# Patient Record
Sex: Female | Born: 1985
Health system: Southern US, Community
[De-identification: ages and names within clinical notes are randomized; demographics above are authoritative.]

## PROBLEM LIST (undated history)

## (undated) DIAGNOSIS — E079 Disorder of thyroid, unspecified: Secondary | ICD-10-CM

## (undated) DIAGNOSIS — I10 Essential (primary) hypertension: Secondary | ICD-10-CM

## (undated) HISTORY — PX: TUBAL LIGATION: SHX77

## (undated) HISTORY — PX: CHOLECYSTECTOMY: SHX55

## (undated) HISTORY — PX: HYSTERECTOMY ABDOMINAL WITH SALPINGECTOMY: SHX6725

---

## 2008-09-23 ENCOUNTER — Emergency Department (HOSPITAL_BASED_OUTPATIENT_CLINIC_OR_DEPARTMENT_OTHER): Admission: EM | Admit: 2008-09-23 | Discharge: 2008-09-23 | Payer: Self-pay | Admitting: Emergency Medicine

## 2008-12-23 ENCOUNTER — Ambulatory Visit: Payer: Self-pay | Admitting: Diagnostic Radiology

## 2008-12-23 ENCOUNTER — Emergency Department (HOSPITAL_BASED_OUTPATIENT_CLINIC_OR_DEPARTMENT_OTHER): Admission: EM | Admit: 2008-12-23 | Discharge: 2008-12-23 | Payer: Self-pay | Admitting: Family Medicine

## 2009-05-07 ENCOUNTER — Emergency Department (HOSPITAL_BASED_OUTPATIENT_CLINIC_OR_DEPARTMENT_OTHER): Admission: EM | Admit: 2009-05-07 | Discharge: 2009-05-07 | Payer: Self-pay | Admitting: Emergency Medicine

## 2010-01-14 ENCOUNTER — Emergency Department (HOSPITAL_BASED_OUTPATIENT_CLINIC_OR_DEPARTMENT_OTHER): Admission: EM | Admit: 2010-01-14 | Discharge: 2010-01-14 | Payer: Self-pay | Admitting: Emergency Medicine

## 2010-04-17 ENCOUNTER — Emergency Department (HOSPITAL_BASED_OUTPATIENT_CLINIC_OR_DEPARTMENT_OTHER): Admission: EM | Admit: 2010-04-17 | Discharge: 2010-04-17 | Payer: Self-pay | Admitting: Emergency Medicine

## 2010-09-05 ENCOUNTER — Emergency Department (HOSPITAL_BASED_OUTPATIENT_CLINIC_OR_DEPARTMENT_OTHER)
Admission: EM | Admit: 2010-09-05 | Discharge: 2010-09-05 | Payer: Self-pay | Source: Home / Self Care | Admitting: Emergency Medicine

## 2010-10-14 ENCOUNTER — Emergency Department (HOSPITAL_BASED_OUTPATIENT_CLINIC_OR_DEPARTMENT_OTHER)
Admission: EM | Admit: 2010-10-14 | Discharge: 2010-10-14 | Payer: Self-pay | Source: Home / Self Care | Admitting: Emergency Medicine

## 2010-10-15 LAB — URINALYSIS, ROUTINE W REFLEX MICROSCOPIC
Bilirubin Urine: NEGATIVE
Hgb urine dipstick: NEGATIVE
Ketones, ur: NEGATIVE mg/dL
Nitrite: NEGATIVE
Protein, ur: NEGATIVE mg/dL
Specific Gravity, Urine: 1.018 (ref 1.005–1.030)
Urine Glucose, Fasting: NEGATIVE mg/dL
Urobilinogen, UA: 0.2 mg/dL (ref 0.0–1.0)
pH: 7.5 (ref 5.0–8.0)

## 2010-10-15 LAB — URINE MICROSCOPIC-ADD ON

## 2010-10-15 LAB — PREGNANCY, URINE: Preg Test, Ur: POSITIVE

## 2010-12-09 LAB — COMPREHENSIVE METABOLIC PANEL
ALT: 20 U/L (ref 0–35)
AST: 24 U/L (ref 0–37)
Albumin: 3.9 g/dL (ref 3.5–5.2)
Alkaline Phosphatase: 80 U/L (ref 39–117)
BUN: 13 mg/dL (ref 6–23)
CO2: 28 mEq/L (ref 19–32)
Calcium: 9 mg/dL (ref 8.4–10.5)
Chloride: 106 mEq/L (ref 96–112)
Creatinine, Ser: 0.7 mg/dL (ref 0.4–1.2)
GFR calc Af Amer: >60 mL/min (ref >60)
GFR calc non Af Amer: >60 mL/min (ref >60)
Glucose, Bld: 85 mg/dL (ref 70–99)
Potassium: 4.1 mEq/L (ref 3.5–5.1)
Sodium: 145 mEq/L (ref 135–145)
Total Bilirubin: 0.6 mg/dL (ref 0.3–1.2)
Total Protein: 7.8 g/dL (ref 6.0–8.3)

## 2010-12-09 LAB — URINALYSIS, ROUTINE W REFLEX MICROSCOPIC
Bilirubin Urine: NEGATIVE
Glucose, UA: NEGATIVE mg/dL
Ketones, ur: NEGATIVE mg/dL
Leukocytes, UA: NEGATIVE
Nitrite: NEGATIVE
Protein, ur: NEGATIVE mg/dL
Specific Gravity, Urine: 1.02 (ref 1.005–1.030)
Urobilinogen, UA: 0.2 mg/dL (ref 0.0–1.0)
pH: 7 (ref 5.0–8.0)

## 2010-12-09 LAB — URINE MICROSCOPIC-ADD ON

## 2010-12-09 LAB — CBC
HCT: 34.3 % — ABNORMAL LOW (ref 36.0–46.0)
Hemoglobin: 11.4 g/dL — ABNORMAL LOW (ref 12.0–15.0)
MCH: 25.6 pg — ABNORMAL LOW (ref 26.0–34.0)
MCHC: 33.2 g/dL (ref 30.0–36.0)
MCV: 76.9 fL — ABNORMAL LOW (ref 78.0–100.0)
Platelets: 205 10*3/uL (ref 150–400)
RBC: 4.46 MIL/uL (ref 3.87–5.11)
RDW: 15.3 % (ref 11.5–15.5)
WBC: 4.8 10*3/uL (ref 4.0–10.5)

## 2010-12-09 LAB — DIFFERENTIAL
Basophils Absolute: 0 10*3/uL (ref 0.0–0.1)
Basophils Relative: 0 % (ref 0–1)
Eosinophils Absolute: 0.1 10*3/uL (ref 0.0–0.7)
Eosinophils Relative: 3 % (ref 0–5)
Lymphocytes Relative: 27 % (ref 12–46)
Lymphs Abs: 1.3 10*3/uL (ref 0.7–4.0)
Monocytes Absolute: 0.4 10*3/uL (ref 0.1–1.0)
Monocytes Relative: 9 % (ref 3–12)
Neutro Abs: 2.9 10*3/uL (ref 1.7–7.7)
Neutrophils Relative %: 61 % (ref 43–77)

## 2010-12-09 LAB — WET PREP, GENITAL
Trich, Wet Prep: NONE SEEN
Yeast Wet Prep HPF POC: NONE SEEN

## 2010-12-09 LAB — LIPASE, BLOOD: Lipase: 20 U/L — ABNORMAL LOW (ref 23–300)

## 2010-12-09 LAB — GC/CHLAMYDIA PROBE AMP, GENITAL
Chlamydia, DNA Probe: NEGATIVE
GC Probe Amp, Genital: NEGATIVE

## 2010-12-09 LAB — PREGNANCY, URINE: Preg Test, Ur: NEGATIVE

## 2010-12-16 LAB — URINALYSIS, ROUTINE W REFLEX MICROSCOPIC
Bilirubin Urine: NEGATIVE
Glucose, UA: NEGATIVE mg/dL
Hgb urine dipstick: NEGATIVE
Ketones, ur: NEGATIVE mg/dL
Nitrite: NEGATIVE
Protein, ur: NEGATIVE mg/dL
Specific Gravity, Urine: 1.016 (ref 1.005–1.030)
Urobilinogen, UA: 0.2 mg/dL (ref 0.0–1.0)
pH: 7 (ref 5.0–8.0)

## 2010-12-16 LAB — CBC
HCT: 32.2 % — ABNORMAL LOW (ref 36.0–46.0)
Hemoglobin: 10.8 g/dL — ABNORMAL LOW (ref 12.0–15.0)
MCHC: 33.5 g/dL (ref 30.0–36.0)
MCV: 79.9 fL (ref 78.0–100.0)
Platelets: 203 10*3/uL (ref 150–400)
RBC: 4.03 MIL/uL (ref 3.87–5.11)
RDW: 15.2 % (ref 11.5–15.5)
WBC: 4.7 10*3/uL (ref 4.0–10.5)

## 2010-12-16 LAB — DIFFERENTIAL
Basophils Absolute: 0.1 10*3/uL (ref 0.0–0.1)
Basophils Relative: 1 % (ref 0–1)
Eosinophils Absolute: 0.1 10*3/uL (ref 0.0–0.7)
Eosinophils Relative: 2 % (ref 0–5)
Lymphocytes Relative: 31 % (ref 12–46)
Lymphs Abs: 1.5 10*3/uL (ref 0.7–4.0)
Monocytes Absolute: 0.4 10*3/uL (ref 0.1–1.0)
Monocytes Relative: 10 % (ref 3–12)
Neutro Abs: 2.6 10*3/uL (ref 1.7–7.7)
Neutrophils Relative %: 56 % (ref 43–77)

## 2010-12-16 LAB — URINE MICROSCOPIC-ADD ON

## 2010-12-16 LAB — PREGNANCY, URINE: Preg Test, Ur: NEGATIVE

## 2010-12-16 LAB — HEMOCCULT GUIAC POC 1CARD (OFFICE): Fecal Occult Bld: NEGATIVE

## 2011-01-03 LAB — URINALYSIS, ROUTINE W REFLEX MICROSCOPIC
Bilirubin Urine: NEGATIVE
Glucose, UA: NEGATIVE mg/dL
Hgb urine dipstick: NEGATIVE
Ketones, ur: NEGATIVE mg/dL
Nitrite: NEGATIVE
Protein, ur: NEGATIVE mg/dL
Specific Gravity, Urine: 1.025 (ref 1.005–1.030)
Urobilinogen, UA: 1 mg/dL (ref 0.0–1.0)
pH: 6.5 (ref 5.0–8.0)

## 2011-01-03 LAB — DIFFERENTIAL
Lymphocytes Relative: 21 % (ref 12–46)
Lymphs Abs: 1.6 10*3/uL (ref 0.7–4.0)
Monocytes Absolute: 0.6 10*3/uL (ref 0.1–1.0)
Monocytes Relative: 8 % (ref 3–12)
Neutro Abs: 5.3 10*3/uL (ref 1.7–7.7)

## 2011-01-03 LAB — BASIC METABOLIC PANEL
CO2: 29 mEq/L (ref 19–32)
Calcium: 9.3 mg/dL (ref 8.4–10.5)
GFR calc Af Amer: >60 mL/min (ref >60)
GFR calc non Af Amer: >60 mL/min (ref >60)
Sodium: 138 mEq/L (ref 135–145)

## 2011-01-03 LAB — CBC
Hemoglobin: 11.9 g/dL — ABNORMAL LOW (ref 12.0–15.0)
RBC: 4.11 MIL/uL (ref 3.87–5.11)
WBC: 7.8 10*3/uL (ref 4.0–10.5)

## 2011-01-03 LAB — URINE MICROSCOPIC-ADD ON

## 2011-01-03 LAB — PREGNANCY, URINE: Preg Test, Ur: POSITIVE

## 2011-01-08 LAB — URINALYSIS, ROUTINE W REFLEX MICROSCOPIC
Glucose, UA: NEGATIVE mg/dL
pH: 5 (ref 5.0–8.0)

## 2011-01-08 LAB — CBC
HCT: 37.9 % (ref 36.0–46.0)
Hemoglobin: 12.6 g/dL (ref 12.0–15.0)
Platelets: 206 10*3/uL (ref 150–400)
WBC: 7.4 10*3/uL (ref 4.0–10.5)

## 2011-01-08 LAB — COMPREHENSIVE METABOLIC PANEL
ALT: 16 U/L (ref 0–35)
Albumin: 4 g/dL (ref 3.5–5.2)
Alkaline Phosphatase: 71 U/L (ref 39–117)
BUN: 14 mg/dL (ref 6–23)
Chloride: 101 mEq/L (ref 96–112)
Glucose, Bld: 94 mg/dL (ref 70–99)
Potassium: 3.8 mEq/L (ref 3.5–5.1)
Sodium: 141 mEq/L (ref 135–145)
Total Bilirubin: 0.4 mg/dL (ref 0.3–1.2)

## 2011-01-08 LAB — GC/CHLAMYDIA PROBE AMP, GENITAL: GC Probe Amp, Genital: NEGATIVE

## 2011-01-08 LAB — WET PREP, GENITAL: Yeast Wet Prep HPF POC: NONE SEEN

## 2012-01-12 ENCOUNTER — Encounter (HOSPITAL_BASED_OUTPATIENT_CLINIC_OR_DEPARTMENT_OTHER): Payer: Self-pay | Admitting: *Deleted

## 2012-01-12 ENCOUNTER — Emergency Department (HOSPITAL_BASED_OUTPATIENT_CLINIC_OR_DEPARTMENT_OTHER)
Admission: EM | Admit: 2012-01-12 | Discharge: 2012-01-12 | Disposition: A | Discharge status: Home / Self Care | Payer: Self-pay | Attending: Emergency Medicine | Admitting: Emergency Medicine

## 2012-01-12 DIAGNOSIS — M545 Low back pain, unspecified: Secondary | ICD-10-CM | POA: Insufficient documentation

## 2012-01-12 DIAGNOSIS — M549 Dorsalgia, unspecified: Secondary | ICD-10-CM

## 2012-01-12 LAB — URINE MICROSCOPIC-ADD ON

## 2012-01-12 LAB — URINALYSIS, ROUTINE W REFLEX MICROSCOPIC
Glucose, UA: NEGATIVE mg/dL
Ketones, ur: NEGATIVE mg/dL
Nitrite: NEGATIVE
Protein, ur: NEGATIVE mg/dL
Urobilinogen, UA: 0.2 mg/dL (ref 0.0–1.0)

## 2012-01-12 LAB — PREGNANCY, URINE: Preg Test, Ur: NEGATIVE

## 2012-01-12 MED ORDER — IBUPROFEN 800 MG PO TABS: 800.0000 mg | ORAL_TABLET | Freq: Three times a day (TID) | ORAL | Status: AC

## 2012-01-12 MED ORDER — OXYCODONE-ACETAMINOPHEN 5-325 MG PO TABS: 2.0000 | ORAL_TABLET | ORAL | Status: AC | PRN

## 2012-01-12 NOTE — ED Notes (Signed)
Pt states that she has had intermittent left flank pain x one week states that pain worsened significantly last Pm and today pt also with intermittent nausea. Pt denies hematuria

## 2012-01-12 NOTE — Discharge Instructions (Signed)
Back Pain, Adult Low back pain is very common. About 1 in 5 people have back pain.The cause of low back pain is rarely dangerous. The pain often gets better over time.About half of people with a sudden onset of back pain feel better in just 2 weeks. About 8 in 10 people feel better by 6 weeks.  CAUSES Some common causes of back pain include:  Strain of the muscles or ligaments supporting the spine.   Wear and tear (degeneration) of the spinal discs.   Arthritis.   Direct injury to the back.  DIAGNOSIS Most of the time, the direct cause of low back pain is not known.However, back pain can be treated effectively even when the exact cause of the pain is unknown.Answering your caregiver's questions about your overall health and symptoms is one of the most accurate ways to make sure the cause of your pain is not dangerous. If your caregiver needs more information, he or she may order lab work or imaging tests (X-rays or MRIs).However, even if imaging tests show changes in your back, this usually does not require surgery. HOME CARE INSTRUCTIONS For many people, back pain returns.Since low back pain is rarely dangerous, it is often a condition that people can learn to manageon their own.   Remain active. It is stressful on the back to sit or stand in one place. Do not sit, drive, or stand in one place for more than 30 minutes at a time. Take short walks on level surfaces as soon as pain allows.Try to increase the length of time you walk each day.   Do not stay in bed.Resting more than 1 or 2 days can delay your recovery.   Do not avoid exercise or work.Your body is made to move.It is not dangerous to be active, even though your back may hurt.Your back will likely heal faster if you return to being active before your pain is gone.   Pay attention to your body when you bend and lift. Many people have less discomfortwhen lifting if they bend their knees, keep the load close to their  bodies,and avoid twisting. Often, the most comfortable positions are those that put less stress on your recovering back.   Find a comfortable position to sleep. Use a firm mattress and lie on your side with your knees slightly bent. If you lie on your back, put a pillow under your knees.   Only take over-the-counter or prescription medicines as directed by your caregiver. Over-the-counter medicines to reduce pain and inflammation are often the most helpful.Your caregiver may prescribe muscle relaxant drugs.These medicines help dull your pain so you can more quickly return to your normal activities and healthy exercise.   Put ice on the injured area.   Put ice in a plastic bag.   Place a towel between your skin and the bag.   Leave the ice on for 15 to 20 minutes, 3 to 4 times a day for the first 2 to 3 days. After that, ice and heat may be alternated to reduce pain and spasms.   Ask your caregiver about trying back exercises and gentle massage. This may be of some benefit.   Avoid feeling anxious or stressed.Stress increases muscle tension and can worsen back pain.It is important to recognize when you are anxious or stressed and learn ways to manage it.Exercise is a great option.  SEEK MEDICAL CARE IF:  You have pain that is not relieved with rest or medicine.   You have   pain that does not improve in 1 week.   You have new symptoms.   You are generally not feeling well.  SEEK IMMEDIATE MEDICAL CARE IF:   You have pain that radiates from your back into your legs.   You develop new bowel or bladder control problems.   You have unusual weakness or numbness in your arms or legs.   You develop nausea or vomiting.   You develop abdominal pain.   You feel faint.  Document Released: 09/14/2005 Document Revised: 09/03/2011 Document Reviewed: 02/02/2011 [employer] Patient Information 2012 [employer]. 

## 2012-01-12 NOTE — ED Notes (Signed)
PA in to evaluate  

## 2012-01-12 NOTE — ED Provider Notes (Signed)
History     CSN: [id number]  Arrival date & time 01/12/12  2007   First MD Initiated Contact with Patient 01/12/12 2016      Chief Complaint  Patient presents with  . Flank Pain    (Consider location/radiation/quality/duration/timing/severity/associated sxs/prior treatment) Patient is a 26 y.o. female presenting with back pain. The history is provided by the patient. No language interpreter was used.  Back Pain  This is a new problem. The current episode started more than 1 week ago. The problem occurs constantly. The problem has not changed since onset.The pain is associated with no known injury. The pain is present in the lumbar spine. The quality of the pain is described as aching. The pain does not radiate. The pain is at a severity of 6/10. The pain is moderate. The symptoms are aggravated by bending. The pain is the same all the time. Stiffness is present all day. She has tried nothing for the symptoms. The treatment provided no relief. Risk factors include obesity.  Pt denies any injury.  Pt has had urinary tract infections in the past.  History reviewed. No pertinent past medical history.  Past Surgical History  Procedure Date  . Cesarean section   . Tubal ligation     History reviewed. No pertinent family history.  History  Substance Use Topics  . Smoking status: Never Smoker   . Smokeless tobacco: Not on file  . Alcohol Use: No    OB History    Grav Para Term Preterm Abortions TAB SAB Ect Mult Living                  Review of Systems  Musculoskeletal: Positive for back pain.  All other systems reviewed and are negative.    Allergies  Hydrocodone bitartrate  Home Medications   Current Outpatient Rx  Name Route Sig Dispense Refill  . DIPHENHYDRAMINE-APAP (SLEEP) 25-500 MG PO TABS Oral Take 2 tablets by mouth at bedtime as needed. For pain      BP 155/99  Pulse 60  Temp 98.8 F (37.1 C)  Resp 18  SpO2 100%  LMP 12/04/2011  Physical Exam    Vitals reviewed. Constitutional: She is oriented to person, place, and time. She appears well-developed and well-nourished.  HENT:  Head: Normocephalic.  Neck: Normal range of motion.  Cardiovascular: Normal heart sounds.   Pulmonary/Chest: Effort normal and breath sounds normal.  Abdominal: Soft. There is no tenderness.  Musculoskeletal: She exhibits tenderness.       Tender ls spine,  Decreased range of motion,  nv and ns intact  Neurological: She is alert and oriented to person, place, and time.  Skin: Skin is warm.  Psychiatric: She has a normal mood and affect.    ED Course  Procedures (including critical care time)  Labs Reviewed  URINALYSIS, ROUTINE W REFLEX MICROSCOPIC - Abnormal; Notable for the following:    Leukocytes, UA TRACE (*)    All other components within normal limits  PREGNANCY, URINE  URINE MICROSCOPIC-ADD ON   No results found.   No diagnosis found.    MDM  Pt given Rx for ibuprofen and Hydrocodone.  I referred to Dr. Pearletha Forge for follow up of back pain       Lonia Skinner [place], [place] 01/12/12 2055

## 2012-01-13 NOTE — ED Provider Notes (Signed)
Medical screening examination/treatment/procedure(s) were performed by non-physician practitioner and as supervising physician I was immediately available for consultation/collaboration.  Geoffery Lyons, MD 01/13/12 2006

## 2012-03-15 ENCOUNTER — Encounter (HOSPITAL_BASED_OUTPATIENT_CLINIC_OR_DEPARTMENT_OTHER): Payer: Self-pay | Admitting: *Deleted

## 2012-03-15 ENCOUNTER — Emergency Department (HOSPITAL_BASED_OUTPATIENT_CLINIC_OR_DEPARTMENT_OTHER)
Admission: EM | Admit: 2012-03-15 | Discharge: 2012-03-15 | Disposition: A | Discharge status: Home / Self Care | Payer: Self-pay | Attending: Emergency Medicine | Admitting: Emergency Medicine

## 2012-03-15 DIAGNOSIS — K029 Dental caries, unspecified: Secondary | ICD-10-CM

## 2012-03-15 MED ORDER — PENICILLIN V POTASSIUM 250 MG PO TABS: 250.0000 mg | ORAL_TABLET | Freq: Four times a day (QID) | ORAL | Status: AC

## 2012-03-15 MED ORDER — OXYCODONE-ACETAMINOPHEN 5-325 MG PO TABS
1.0000 | ORAL_TABLET | Freq: Once | ORAL | Status: AC
  Administered 2012-03-15: 1 via ORAL
  Filled 2012-03-15: qty 1

## 2012-03-15 MED ORDER — OXYCODONE-ACETAMINOPHEN 5-325 MG PO TABS: 1.0000 | ORAL_TABLET | Freq: Four times a day (QID) | ORAL | Status: AC | PRN

## 2012-03-15 NOTE — ED Notes (Signed)
Pt c/o left lower jaw and tooth pain x 3 weeks

## 2012-03-15 NOTE — ED Provider Notes (Addendum)
History     CSN: [id number]  Arrival date & time 03/15/12  1711   First MD Initiated Contact with Patient 03/15/12 1724      Chief Complaint  Patient presents with  . Dental Pain    (Consider location/radiation/quality/duration/timing/severity/associated sxs/prior treatment) HPI Comments: Pain is a 9/10 and sharp in nature  Patient is a 26 y.o. female presenting with tooth pain. The history is provided by the patient.  Dental PainThe primary symptoms include mouth pain. Primary symptoms do not include dental injury, fever or shortness of breath. Episode onset: 3 weeks ago but severe pain for the last 2 days. The symptoms are worsening. The symptoms are new. The symptoms occur constantly.  Additional symptoms include: gum tenderness, jaw pain and ear pain. Additional symptoms do not include: gum swelling, trismus, facial swelling, pain with swallowing, taste disturbance, smell disturbance, hearing loss and swollen glands. Medical issues do not include: alcohol problem and smoking.    History reviewed. No pertinent past medical history.  Past Surgical History  Procedure Date  . Cesarean section   . Tubal ligation     History reviewed. No pertinent family history.  History  Substance Use Topics  . Smoking status: Never Smoker   . Smokeless tobacco: Not on file  . Alcohol Use: No    OB History    Grav Para Term Preterm Abortions TAB SAB Ect Mult Living                  Review of Systems  Constitutional: Negative for fever.  HENT: Positive for ear pain. Negative for hearing loss and facial swelling.   Respiratory: Negative for shortness of breath.   All other systems reviewed and are negative.    Allergies  Hydrocodone bitartrate  Home Medications   Current Outpatient Rx  Name Route Sig Dispense Refill  . DIPHENHYDRAMINE-APAP (SLEEP) 25-500 MG PO TABS Oral Take 2 tablets by mouth at bedtime as needed. For pain      BP 154/100  Pulse 84  Temp 98.7 F (37.1  C) (Oral)  Resp 18  Ht 5\' 1"  (1.549 m)  Wt 260 lb (117.935 kg)  BMI 49.13 kg/m2  SpO2 100%  LMP 01/27/2012  Physical Exam  Nursing note and vitals reviewed. Constitutional: She is oriented to person, place, and time. She appears well-developed and well-nourished. She appears distressed.  HENT:  Head: Normocephalic and atraumatic. No trismus in the jaw.  Left Ear: Tympanic membrane and ear canal normal.  Mouth/Throat: Oropharynx is clear and moist and mucous membranes are normal. Dental caries present. No dental abscesses or uvula swelling.    Eyes: EOM are normal. Pupils are equal, round, and reactive to light.  Neck: Normal range of motion. Neck supple.  Pulmonary/Chest: Effort normal. No respiratory distress.  Musculoskeletal: Normal range of motion. She exhibits no tenderness.       No edema  Lymphadenopathy:    She has no cervical adenopathy.  Neurological: She is alert and oriented to person, place, and time. No cranial nerve deficit.  Skin: Skin is warm and dry. No rash noted.  Psychiatric: She has a normal mood and affect. Her behavior is normal.    ED Course  Procedures (including critical care time)  Labs Reviewed - No data to display No results found.   1. Dental caries       MDM   Pt with dental caries and no facial swelling.  No signs of ludwig's angina or difficulty swallowing and no  systemic symptoms. Will treat with PCN and have pt f/u with dentist.         Gwyneth Sprout, MD 03/15/12 1731  Gwyneth Sprout, MD 03/15/12 1734

## 2012-11-14 IMAGING — US US TRANSVAGINAL NON-OB
1 series · 13 of 25 positions shown · non-contrast
Comparison: CT [DATE]

CLINICAL DATA: Right lower quadrant pain.  Heavy vaginal bleeding
with nausea and dizziness.  IUD for 6 months.  Unsure LMP.



[Series 1: us transvaginal non-ob · 0.33mm/px · 13 of 49 slices shown]
[im 1/49]
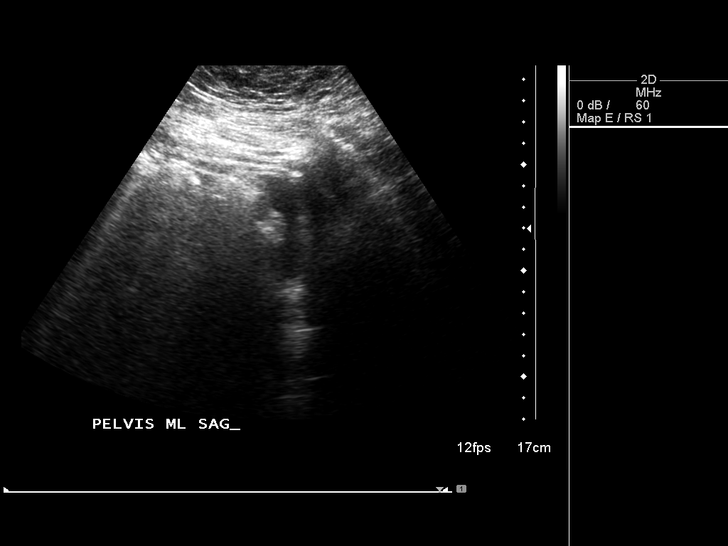
[im 5/49]
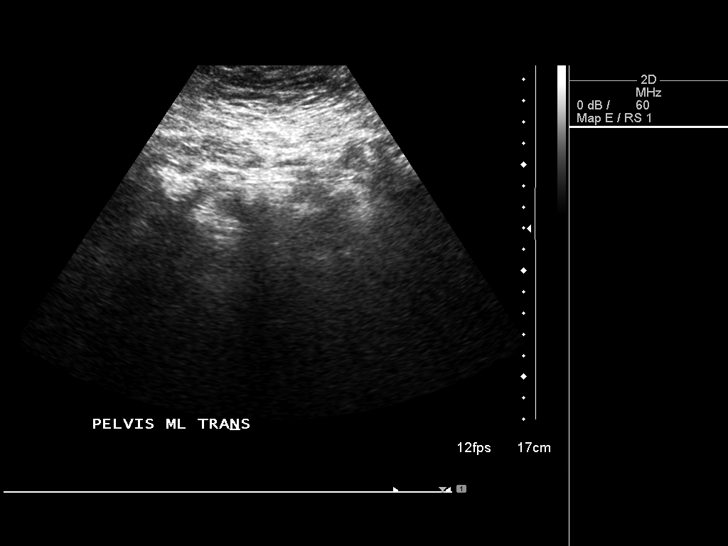
[im 9/49]
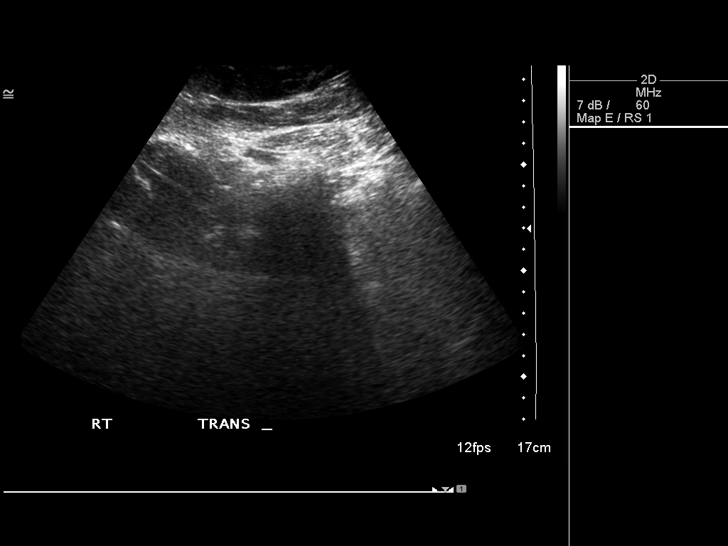
[im 13/49]
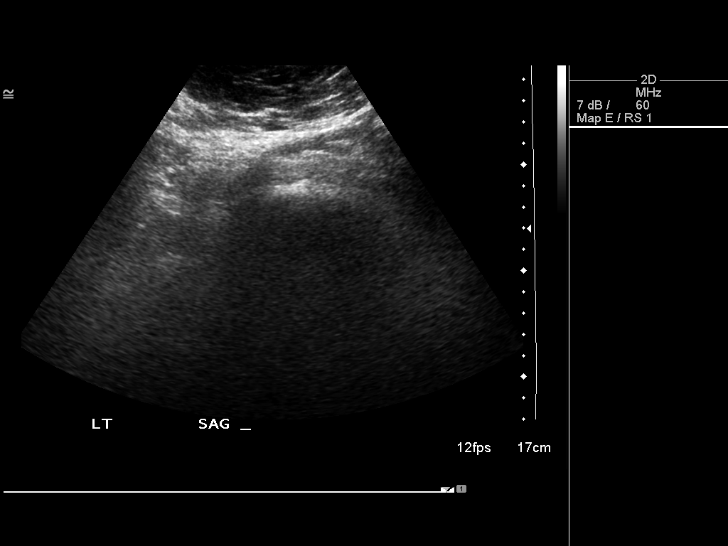
[im 17/49]
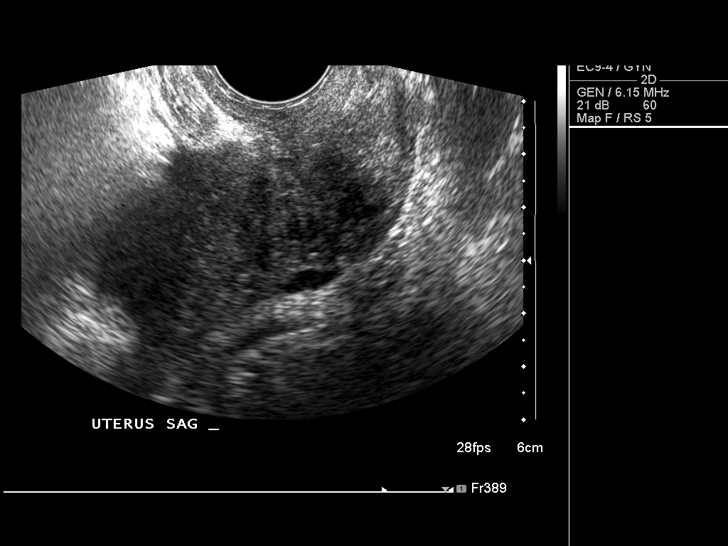
[im 21/49]
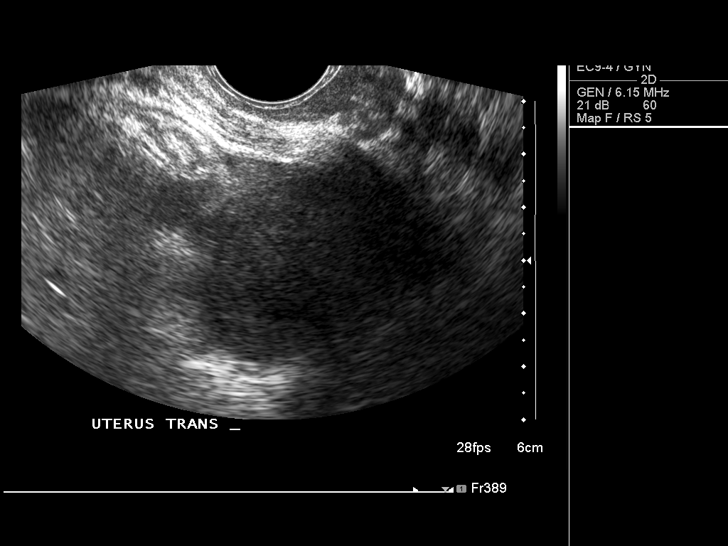
[im 25/49]
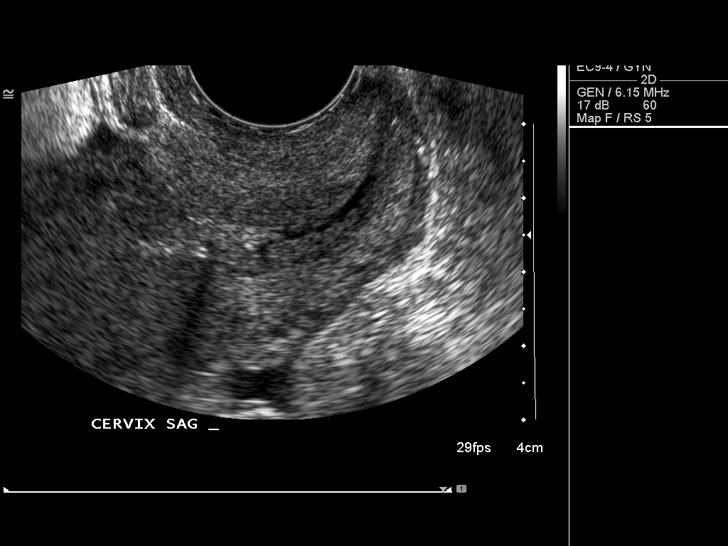
[im 29/49]
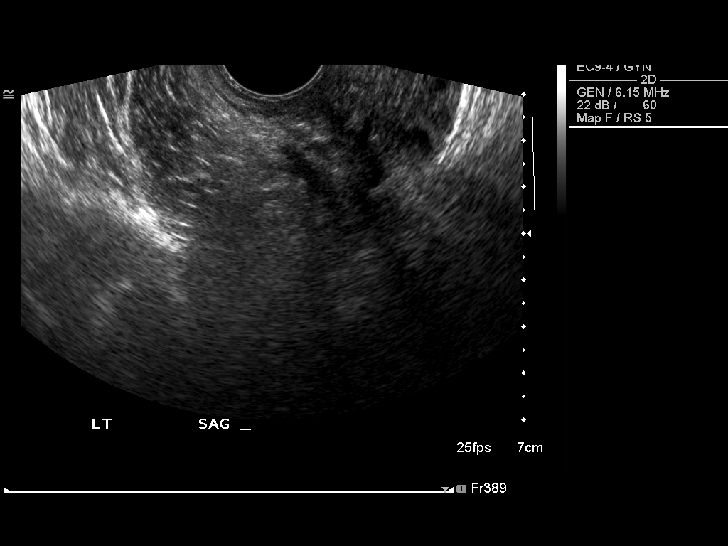
[im 33/49]
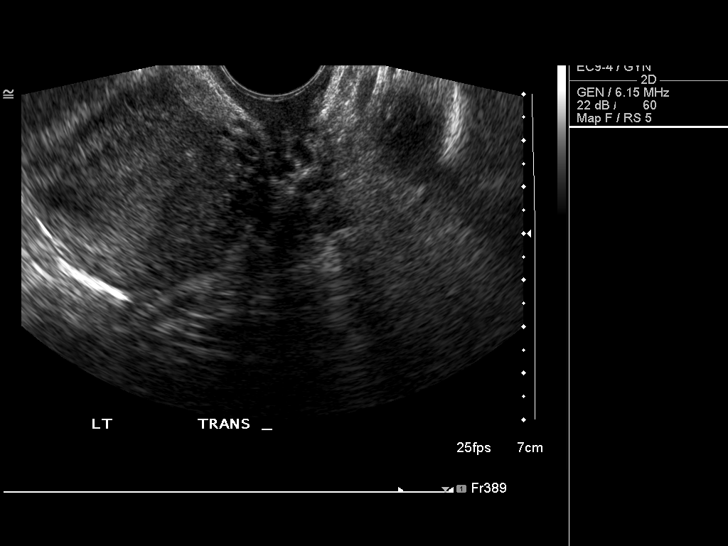
[im 37/49]
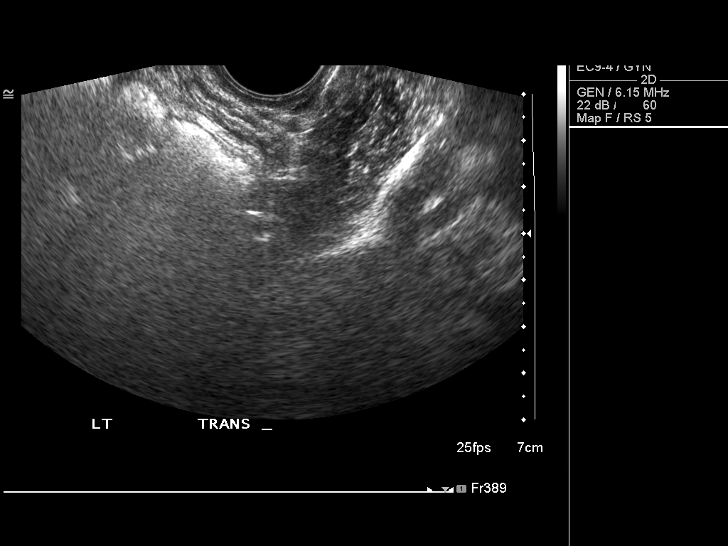
[im 41/49]
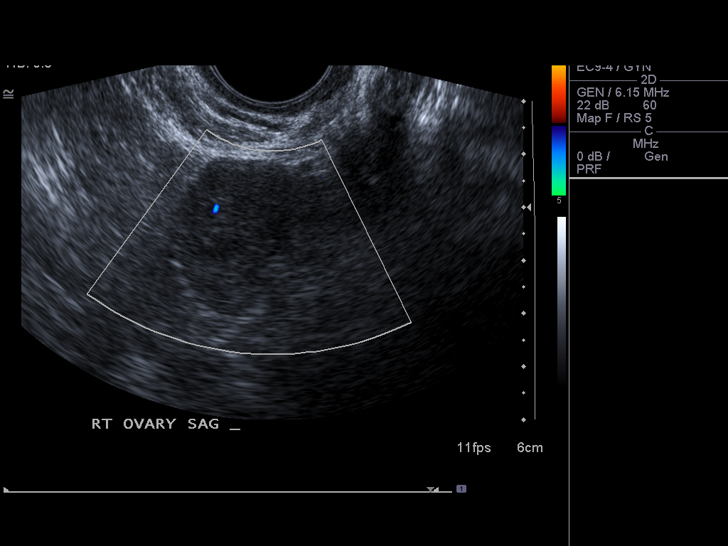
[im 45/49]
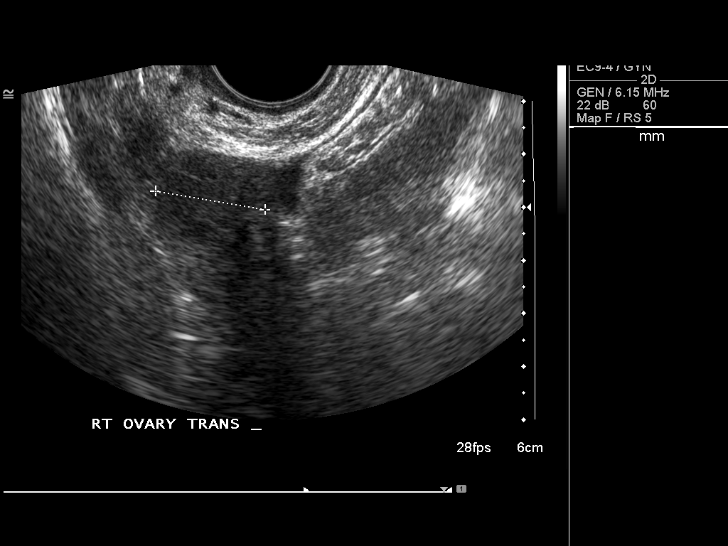
[im 49/49]
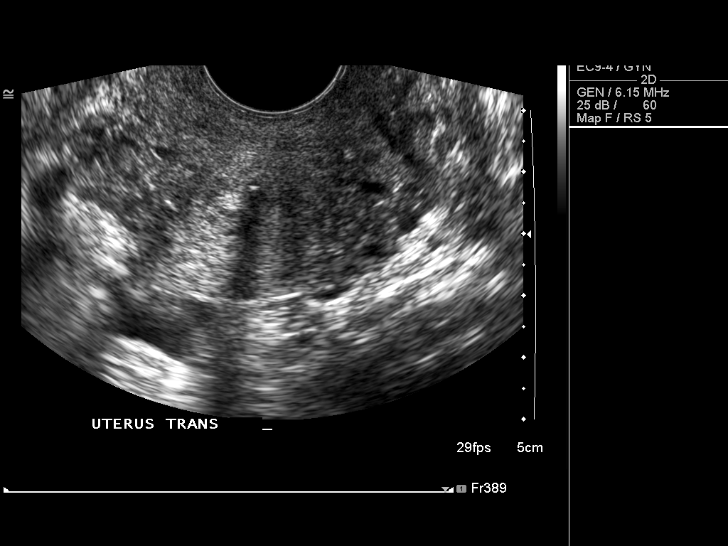

[13 of 25 positions shown; findings below may reference images not displayed]

FINDINGS: Uterus the uterus demonstrates a sagittal length of 7.4 cm, an AP
depth of 3.5 cm and a transverse width of 4.3 cm.  A homogeneous
uterine myometrium is seen.

Endometrium  the patient's IUD is malpositioned with position in
the region of the endocervical canal. The position of the IUD limbs
can not be determined on these 2D images. The upper portion of the
endometrial lining is homogeneously echogenic with an AP width of
6.4 mm.  No areas of focal thickening are identified.

Right Ovary measures 3.1 x 2.3 x 2.1 cm and has a normal appearance

Left Ovary is not seen with confidence either transabdominally or
endovaginally.

Other Findings:  No pelvic fluid or separate adnexal masses are
noted.
IMPRESSION: IUD malpositioning with positioning in the region of  the
endocervical canal.

Otherwise unremarkable uterine myometrium, endometrium and ovaries.

## 2012-12-29 ENCOUNTER — Other Ambulatory Visit: Payer: Self-pay | Admitting: Emergency Medicine

## 2012-12-29 DIAGNOSIS — G44009 Cluster headache syndrome, unspecified, not intractable: Secondary | ICD-10-CM

## 2012-12-30 ENCOUNTER — Other Ambulatory Visit: Payer: Self-pay

## 2013-02-06 ENCOUNTER — Emergency Department (HOSPITAL_BASED_OUTPATIENT_CLINIC_OR_DEPARTMENT_OTHER)
Admission: EM | Admit: 2013-02-06 | Discharge: 2013-02-06 | Disposition: A | Discharge status: Home / Self Care | Payer: BC Managed Care – PPO | Attending: Emergency Medicine | Admitting: Emergency Medicine

## 2013-02-06 ENCOUNTER — Encounter (HOSPITAL_BASED_OUTPATIENT_CLINIC_OR_DEPARTMENT_OTHER): Payer: Self-pay | Admitting: *Deleted

## 2013-02-06 ENCOUNTER — Emergency Department (HOSPITAL_BASED_OUTPATIENT_CLINIC_OR_DEPARTMENT_OTHER): Payer: BC Managed Care – PPO

## 2013-02-06 DIAGNOSIS — J020 Streptococcal pharyngitis: Secondary | ICD-10-CM | POA: Insufficient documentation

## 2013-02-06 DIAGNOSIS — H9209 Otalgia, unspecified ear: Secondary | ICD-10-CM | POA: Insufficient documentation

## 2013-02-06 DIAGNOSIS — R51 Headache: Secondary | ICD-10-CM | POA: Insufficient documentation

## 2013-02-06 DIAGNOSIS — R059 Cough, unspecified: Secondary | ICD-10-CM | POA: Insufficient documentation

## 2013-02-06 DIAGNOSIS — R05 Cough: Secondary | ICD-10-CM | POA: Insufficient documentation

## 2013-02-06 DIAGNOSIS — R509 Fever, unspecified: Secondary | ICD-10-CM | POA: Insufficient documentation

## 2013-02-06 LAB — RAPID STREP SCREEN (MED CTR MEBANE ONLY): Streptococcus, Group A Screen (Direct): POSITIVE — AB

## 2013-02-06 MED ORDER — HYDROCODONE-ACETAMINOPHEN 7.5-500 MG/15ML PO SOLN: 15.0000 mL | Freq: Four times a day (QID) | ORAL | Status: DC | PRN

## 2013-02-06 MED ORDER — IBUPROFEN 800 MG PO TABS: 800.0000 mg | ORAL_TABLET | Freq: Once | ORAL | Status: DC

## 2013-02-06 MED ORDER — IBUPROFEN 100 MG/5ML PO SUSP
800.0000 mg | Freq: Once | ORAL | Status: AC
  Administered 2013-02-06: 800 mg via ORAL
  Filled 2013-02-06: qty 40

## 2013-02-06 MED ORDER — PENICILLIN G BENZATHINE 1200000 UNIT/2ML IM SUSP
1.2000 10*6.[IU] | Freq: Once | INTRAMUSCULAR | Status: AC
  Administered 2013-02-06: 1.2 10*6.[IU] via INTRAMUSCULAR
  Filled 2013-02-06: qty 2

## 2013-02-06 NOTE — ED Provider Notes (Signed)
History  This chart was scribed for Glynn Octave, MD by Shari Heritage, ED Scribe. The patient was seen in room MH05/MH05. Patient's care was started at 2125.   CSN: [id number]  Arrival date & time 02/06/13  2115   First MD Initiated Contact with Patient 02/06/13 2125      Chief Complaint  Patient presents with  . Sore Throat     The history is provided by the patient. No language interpreter was used.    HPI Comments: Wendy Gordon is a 27 y.o. female who presents to the Emergency Department complaining of persistent, moderate sore throat and bilateral ear pain onset 3 days ago. She states that symptoms have gradually worsened since onset. She says throat pain is worse with palpation and she has pain with swallowing. There is associated mild headache and intermittent subjective fever. She also reports of cough productive of thick sputum. She denies abdominal pain, rhinorrhea, chest pain, shortness of breath or any other symptoms. She hasn't taken any recent long car or plane trips. She denies any sick contacts with similar symptoms. Patient has no chronic medical conditions. She does not take any medicines on a regular basis. She isn't on any birth control. She does not smoke cigarettes.   History reviewed. No pertinent past medical history.  Past Surgical History  Procedure Laterality Date  . Cesarean section    . Tubal ligation      No family history on file.  History  Substance Use Topics  . Smoking status: Never Smoker   . Smokeless tobacco: Not on file  . Alcohol Use: No    OB History   Grav Para Term Preterm Abortions TAB SAB Ect Mult Living                  Review of Systems A complete 10 system review of systems was obtained and all systems are negative except as noted in the HPI and PMH.   Allergies  Hydrocodone bitartrate  Home Medications   Current Outpatient Rx  Name  Route  Sig  Dispense  Refill  . diphenhydramine-acetaminophen (TYLENOL PM EXTRA  STRENGTH) 25-500 MG TABS   Oral   Take 2 tablets by mouth at bedtime as needed. For pain         . HYDROcodone-acetaminophen (LORTAB) 7.5-500 MG/15ML solution   Oral   Take 15 mLs by mouth every 6 (six) hours as needed for pain.   120 mL   0   . ibuprofen (ADVIL,MOTRIN) 200 MG tablet   Oral   Take 800 mg by mouth every 6 (six) hours as needed. Patient used this medication for her pain.           Triage Vitals: BP 136/94  Pulse 102  Temp(Src) 99.1 F (37.3 C) (Oral)  Resp 20  Wt 260 lb (117.935 kg)  BMI 49.15 kg/m2  SpO2 100%  Physical Exam  Constitutional: She is oriented to person, place, and time. She appears well-developed and well-nourished.  HENT:  Head: Normocephalic and atraumatic.  Right Ear: A middle ear effusion is present.  Left Ear: Tympanic membrane, external ear and ear canal normal.  Mouth/Throat: Oropharyngeal exudate and posterior oropharyngeal erythema present.  Bilateral tonsils erythematous with exudate. No asymmetry. Serous effusion on the right. Left ear is normal.  Eyes: Conjunctivae and EOM are normal. Pupils are equal, round, and reactive to light.  Neck: Normal range of motion. Neck supple.  No meningismus.   Cardiovascular: Normal rate, regular rhythm and  normal heart sounds.   No murmur heard. Pulmonary/Chest: Effort normal and breath sounds normal.  Abdominal: Soft. There is no tenderness.  Musculoskeletal: Normal range of motion.  Neurological: She is alert and oriented to person, place, and time.  Skin: Skin is warm and dry. No rash noted.    ED Course  Procedures (including critical care time) DIAGNOSTIC STUDIES: Oxygen Saturation is 100% on room air, normal by my interpretation.    COORDINATION OF CARE: 9:40 PM- Patient informed of current plan for treatment and evaluation and agrees with plan at this time.   9:50 PM- Strep screen is positive. Will administer a shot of penicillin here to treat. Will also order a chest x-ray to  check for pneumonia's due to patient's cough.    Labs Reviewed  RAPID STREP SCREEN - Abnormal; Notable for the following:    Streptococcus, Group A Screen (Direct) POSITIVE (*)    All other components within normal limits    Dg Chest 2 View  02/06/2013  *RADIOLOGY REPORT*  Clinical Data: Cough, sore throat.  CHEST - 2 VIEW  Comparison: None  Findings: Heart and mediastinal contours are within normal limits. No focal opacities or effusions.  No acute bony abnormality.  IMPRESSION: No active cardiopulmonary disease.   Original Report Authenticated By: Charlett Nose, M.D.      1. Strep pharyngitis       MDM  3 days of sore throat and bilateral ear pain,. No fever.  Lungs clear.  Cough productive of thick sputum.  Rapid strep positive.  Bicillin given.  No asymmetry. CXR clear. Tolerating PO, nontoxic appearing.    I personally performed the services described in this documentation, which was scribed in my presence. The recorded information has been reviewed and is accurate.    Glynn Octave, MD 02/06/13 [phone]

## 2013-02-06 NOTE — ED Notes (Signed)
Sore throat and ear pain x 3 days.  

## 2013-04-24 ENCOUNTER — Ambulatory Visit: Payer: BC Managed Care – PPO | Admitting: Family Medicine

## 2013-08-10 ENCOUNTER — Encounter (HOSPITAL_BASED_OUTPATIENT_CLINIC_OR_DEPARTMENT_OTHER): Payer: Self-pay | Admitting: Emergency Medicine

## 2013-08-10 ENCOUNTER — Emergency Department (HOSPITAL_BASED_OUTPATIENT_CLINIC_OR_DEPARTMENT_OTHER)
Admission: EM | Admit: 2013-08-10 | Discharge: 2013-08-10 | Disposition: A | Discharge status: Home / Self Care | Payer: BC Managed Care – PPO | Attending: Emergency Medicine | Admitting: Emergency Medicine

## 2013-08-10 DIAGNOSIS — Z3202 Encounter for pregnancy test, result negative: Secondary | ICD-10-CM | POA: Insufficient documentation

## 2013-08-10 DIAGNOSIS — Z862 Personal history of diseases of the blood and blood-forming organs and certain disorders involving the immune mechanism: Secondary | ICD-10-CM | POA: Insufficient documentation

## 2013-08-10 DIAGNOSIS — R42 Dizziness and giddiness: Secondary | ICD-10-CM

## 2013-08-10 DIAGNOSIS — Z8639 Personal history of other endocrine, nutritional and metabolic disease: Secondary | ICD-10-CM | POA: Insufficient documentation

## 2013-08-10 DIAGNOSIS — R112 Nausea with vomiting, unspecified: Secondary | ICD-10-CM | POA: Insufficient documentation

## 2013-08-10 DIAGNOSIS — R51 Headache: Secondary | ICD-10-CM | POA: Insufficient documentation

## 2013-08-10 DIAGNOSIS — Z79899 Other long term (current) drug therapy: Secondary | ICD-10-CM | POA: Insufficient documentation

## 2013-08-10 HISTORY — DX: Disorder of thyroid, unspecified: E07.9

## 2013-08-10 LAB — CBC WITH DIFFERENTIAL/PLATELET
Basophils Absolute: 0 10*3/uL (ref 0.0–0.1)
Basophils Relative: 0 % (ref 0–1)
Eosinophils Relative: 2 % (ref 0–5)
HCT: 33.1 % — ABNORMAL LOW (ref 36.0–46.0)
Hemoglobin: 10.6 g/dL — ABNORMAL LOW (ref 12.0–15.0)
Lymphocytes Relative: 37 % (ref 12–46)
MCH: 24.9 pg — ABNORMAL LOW (ref 26.0–34.0)
MCHC: 32 g/dL (ref 30.0–36.0)
MCV: 77.9 fL — ABNORMAL LOW (ref 78.0–100.0)
Monocytes Absolute: 0.7 10*3/uL (ref 0.1–1.0)
Monocytes Relative: 12 % (ref 3–12)
Neutro Abs: 3 10*3/uL (ref 1.7–7.7)
RDW: 15.2 % (ref 11.5–15.5)

## 2013-08-10 LAB — BASIC METABOLIC PANEL
BUN: 8 mg/dL (ref 6–23)
CO2: 30 mEq/L (ref 19–32)
Calcium: 8.9 mg/dL (ref 8.4–10.5)
Chloride: 102 mEq/L (ref 96–112)
Creatinine, Ser: 0.7 mg/dL (ref 0.50–1.10)
GFR calc Af Amer: >90 mL/min (ref >90)
Glucose, Bld: 80 mg/dL (ref 70–99)

## 2013-08-10 LAB — URINE MICROSCOPIC-ADD ON

## 2013-08-10 LAB — URINALYSIS, ROUTINE W REFLEX MICROSCOPIC
Bilirubin Urine: NEGATIVE
Glucose, UA: NEGATIVE mg/dL
Ketones, ur: NEGATIVE mg/dL
Specific Gravity, Urine: 1.013 (ref 1.005–1.030)
pH: 7.5 (ref 5.0–8.0)

## 2013-08-10 MED ORDER — MECLIZINE HCL 25 MG PO TABS
12.5000 mg | ORAL_TABLET | Freq: Once | ORAL | Status: AC
  Administered 2013-08-10: 12.5 mg via ORAL
  Filled 2013-08-10: qty 1

## 2013-08-10 MED ORDER — ONDANSETRON 4 MG PO TBDP
4.0000 mg | ORAL_TABLET | Freq: Once | ORAL | Status: AC
  Administered 2013-08-10: 4 mg via ORAL
  Filled 2013-08-10: qty 1

## 2013-08-10 MED ORDER — ACETAMINOPHEN 325 MG PO TABS
ORAL_TABLET | ORAL | Status: AC
  Filled 2013-08-10: qty 2

## 2013-08-10 MED ORDER — ACETAMINOPHEN 325 MG PO TABS
650.0000 mg | ORAL_TABLET | Freq: Once | ORAL | Status: AC
  Administered 2013-08-10: 650 mg via ORAL
  Filled 2013-08-10: qty 2

## 2013-08-10 NOTE — ED Provider Notes (Signed)
CSN: [id number]     Arrival date & time 08/10/13  0865 History   First MD Initiated Contact with Patient 08/10/13 1913     Chief Complaint  Patient presents with  . Dizziness   (Consider location/radiation/quality/duration/timing/severity/associated sxs/prior Treatment) Patient is a 27 y.o. female presenting with neurologic complaint.  Neurologic Problem This is a new problem. The current episode started today. The problem has been unchanged. Associated symptoms include nausea, vertigo and vomiting. Pertinent negatives include no fever or rash. She has tried position changes and lying down for the symptoms. The treatment provided mild relief.  Patient reports sudden onset of dizziness with nausea and vomiting this afternoon, with associated headache.  Recent URI symptoms.  Patient recently started on methimazole for thyroid disorder, and current symptoms are in thecommon side effect profile. Past Medical History  Diagnosis Date  . Thyroid disease    Past Surgical History  Procedure Laterality Date  . Cesarean section    . Tubal ligation     History reviewed. No pertinent family history. History  Substance Use Topics  . Smoking status: Never Smoker   . Smokeless tobacco: Not on file  . Alcohol Use: No   OB History   Grav Para Term Preterm Abortions TAB SAB Ect Mult Living                 Review of Systems  Constitutional: Negative for fever.  Gastrointestinal: Positive for nausea and vomiting.  Skin: Negative for rash.  Neurological: Positive for vertigo.  All other systems reviewed and are negative.    Allergies  Hydrocodone bitartrate  Home Medications   Current Outpatient Rx  Name  Route  Sig  Dispense  Refill  . methimazole (TAPAZOLE) 5 MG tablet   Oral   Take 5 mg by mouth 3 (three) times daily.         . diphenhydramine-acetaminophen (TYLENOL PM EXTRA STRENGTH) 25-500 MG TABS   Oral   Take 2 tablets by mouth at bedtime as needed. For pain         .  HYDROcodone-acetaminophen (LORTAB) 7.5-500 MG/15ML solution   Oral   Take 15 mLs by mouth every 6 (six) hours as needed for pain.   120 mL   0   . ibuprofen (ADVIL,MOTRIN) 200 MG tablet   Oral   Take 800 mg by mouth every 6 (six) hours as needed. Patient used this medication for her pain.          BP 157/97  Pulse 84  Temp(Src) 98.9 F (37.2 C) (Oral)  Resp 18  Ht 5\' 2"  (1.575 m)  Wt 289 lb (131.09 kg)  BMI 52.85 kg/m2  SpO2 100%  LMP 08/06/2013 Physical Exam  Nursing note and vitals reviewed. Constitutional: She is oriented to person, place, and time. She appears well-developed and well-nourished.  HENT:  Head: Normocephalic and atraumatic.  Eyes: EOM are normal. Pupils are equal, round, and reactive to light. Right eye exhibits no nystagmus. Left eye exhibits no nystagmus.  Neck: Neck supple.  Cardiovascular: Normal rate and regular rhythm.   Pulmonary/Chest: Effort normal and breath sounds normal.  Abdominal: Soft. Bowel sounds are normal.  Musculoskeletal: She exhibits no edema and no tenderness.  Lymphadenopathy:    She has no cervical adenopathy.  Neurological: She is alert and oriented to person, place, and time. She has normal strength.  Skin: Skin is warm and dry.  Psychiatric: She has a normal mood and affect. Her behavior is normal. Judgment and  thought content normal.    ED Course  Procedures (including critical care time) Labs Review Labs Reviewed  PREGNANCY, URINE  URINALYSIS, ROUTINE W REFLEX MICROSCOPIC  CBC WITH DIFFERENTIAL  BASIC METABOLIC PANEL   Imaging Review No results found.  EKG Interpretation   None      Laboratory results reviewed and shared with patient.  Patient feels better after medications. MDM  Dizziness, question medication reaction versus peripheral vertigo.  Patient discharged home with return precautions discussed.  Will follow-up with her PCP.    Jimmye Norman, NP 08/10/13 [phone]

## 2013-08-10 NOTE — ED Notes (Signed)
C/o dizziness, nausea and headache today. Started new med recently. Already eval by EDNP Felicie Morn

## 2013-08-10 NOTE — ED Notes (Signed)
NP at bedside.

## 2013-08-10 NOTE — ED Notes (Signed)
Pt c/o dizziness x 4 hrs ago also c/o h/a x 1 hr, pt reports starting new med Methimazole

## 2013-08-14 NOTE — ED Provider Notes (Signed)
Medical screening examination/treatment/procedure(s) were performed by non-physician practitioner and as supervising physician I was immediately available for consultation/collaboration.  EKG Interpretation   None         Shelda Jakes, MD 08/14/13 [phone]

## 2013-12-09 ENCOUNTER — Encounter (HOSPITAL_BASED_OUTPATIENT_CLINIC_OR_DEPARTMENT_OTHER): Payer: Self-pay | Admitting: Emergency Medicine

## 2013-12-09 ENCOUNTER — Emergency Department (HOSPITAL_BASED_OUTPATIENT_CLINIC_OR_DEPARTMENT_OTHER)
Admission: EM | Admit: 2013-12-09 | Discharge: 2013-12-09 | Disposition: A | Discharge status: Home / Self Care | Payer: Managed Care, Other (non HMO) | Attending: Emergency Medicine | Admitting: Emergency Medicine

## 2013-12-09 DIAGNOSIS — Z79899 Other long term (current) drug therapy: Secondary | ICD-10-CM | POA: Insufficient documentation

## 2013-12-09 DIAGNOSIS — K029 Dental caries, unspecified: Secondary | ICD-10-CM | POA: Insufficient documentation

## 2013-12-09 DIAGNOSIS — Z8639 Personal history of other endocrine, nutritional and metabolic disease: Secondary | ICD-10-CM | POA: Insufficient documentation

## 2013-12-09 DIAGNOSIS — Z862 Personal history of diseases of the blood and blood-forming organs and certain disorders involving the immune mechanism: Secondary | ICD-10-CM | POA: Insufficient documentation

## 2013-12-09 MED ORDER — IBUPROFEN 800 MG PO TABS: 800.0000 mg | ORAL_TABLET | Freq: Three times a day (TID) | ORAL | Status: DC

## 2013-12-09 MED ORDER — AMOXICILLIN 500 MG PO CAPS: 500.0000 mg | ORAL_CAPSULE | Freq: Three times a day (TID) | ORAL | Status: DC

## 2013-12-09 NOTE — Discharge Instructions (Signed)
Dental Caries   Dental caries (also called tooth decay) is the most common oral disease. It can occur at any age, but is more common in children and young adults.   HOW DENTAL CARIES DEVELOPS   The process of decay begins when bacteria and foods (particularly sugars and starches) combine in your mouth to produce plaque. Plaque is a substance that sticks to the hard, outer surface of a tooth (enamel). The bacteria in plaque produce acids that attack enamel. These acids may also attack the root surface of a tooth (cementum) if it is exposed. Repeated attacks dissolve these surfaces and create holes in the tooth (cavities). If left untreated, the acids destroy the other layers of the tooth.   RISK FACTORS  · Frequent sipping of sugary beverages.    · Frequent snacking on sugary and starchy foods, especially those that easily get stuck in the teeth.    · Poor oral hygiene.    · Dry mouth.    · Substance abuse such as methamphetamine abuse.    · Broken or poor-fitting dental restorations.    · Eating disorders.    · Gastroesophageal reflux disease (GERD).    · Certain radiation treatments to the head and neck.  SYMPTOMS  In the early stages of dental caries, symptoms are seldom present. Sometimes white, chalky areas may be seen on the enamel or other tooth layers. In later stages, symptoms may include:  · Pits and holes on the enamel.  · Toothache after sweet, hot, or cold foods or drinks are consumed.  · Pain around the tooth.  · Swelling around the tooth.  DIAGNOSIS   Most of the time, dental caries is detected during a regular dental checkup. A diagnosis is made after a thorough medical and dental history is taken and the surfaces of your teeth are checked for signs of dental caries. Sometimes special instruments, such as lasers, are used to check for dental caries. Dental X-ray exams may be taken so that areas not visible to the eye (such as between the contact areas of the teeth) can be checked for cavities.    TREATMENT   If dental caries is in its early stages, it may be reversed with a fluoride treatment or an application of a remineralizing agent at the dental office. Thorough brushing and flossing at home is needed to aid these treatments. If it is in its later stages, treatment depends on the location and extent of tooth destruction:   · If a small area of the tooth has been destroyed, the destroyed area will be removed and cavities will be filled with a material such as gold, silver amalgam, or composite resin.    · If a large area of the tooth has been destroyed, the destroyed area will be removed and a cap (crown) will be fitted over the remaining tooth structure.    · If the center part of the tooth (pulp) is affected, a procedure called a root canal will be needed before a filling or crown can be placed.    · If most of the tooth has been destroyed, the tooth may need to be pulled (extracted).  HOME CARE INSTRUCTIONS  You can prevent, stop, or reverse dental caries at home by practicing good oral hygiene. Good oral hygiene includes:  · Thoroughly cleaning your teeth at least twice a day with a toothbrush and dental floss.    · Using a fluoride toothpaste. A fluoride mouth rinse may also be used if recommended by your dentist or health care provider.    ·   Restricting the amount of sugary and starchy foods and sugary liquids you consume.    · Avoiding frequent snacking on these foods and sipping of these liquids.    · Keeping regular visits with a dentist for checkups and cleanings.  PREVENTION   · Practice good oral hygiene.  · Consider a dental sealant. A dental sealant is a coating material that is applied by your dentist to the pits and grooves of teeth. The sealant prevents food from being trapped in them. It may protect the teeth for several years.  · Ask about fluoride supplements if you live in a community without fluorinated water or with water that has a low fluoride content. Use fluoride supplements  as directed by your dentist or health care provider.  · Allow fluoride varnish applications to teeth if directed by your dentist or health care provider.  Document Released: 06/06/2002 Document Revised: 05/17/2013 Document Reviewed: 09/16/2012  [employer]® Patient Information ©2014 [employer].

## 2013-12-09 NOTE — ED Provider Notes (Signed)
CSN: [id number]     Arrival date & time 12/09/13  1926 History   First MD Initiated Contact with Patient 12/09/13 2036     Chief Complaint  Patient presents with  . Dental Pain     (Consider location/radiation/quality/duration/timing/severity/associated sxs/prior Treatment) HPI Comments: Patient here with dental pain to tooth #18 - states that she has appointment to see her dentist this coming week - no fever chills, nausea vomiting or diarrhea, is able to eat and drink without difficulty  Patient is a 28 y.o. female presenting with tooth pain. The history is provided by the patient. No language interpreter was used.  Dental Pain Location:  Lower Lower teeth location:  18/LL 2nd molar Quality:  Aching and dull Severity:  Moderate Onset quality:  Gradual Duration:  1 week Timing:  Constant Progression:  Worsening Chronicity:  New Context: dental caries   Context: not abscess, not crown fracture and not dental fracture   Relieved by:  Nothing Worsened by:  Nothing tried Ineffective treatments:  None tried Associated symptoms: no drooling, no facial pain, no facial swelling, no fever, no headaches, no neck pain and no trismus     Past Medical History  Diagnosis Date  . Thyroid disease    Past Surgical History  Procedure Laterality Date  . Cesarean section    . Tubal ligation     No family history on file. History  Substance Use Topics  . Smoking status: Never Smoker   . Smokeless tobacco: Not on file  . Alcohol Use: No   OB History   Grav Para Term Preterm Abortions TAB SAB Ect Mult Living                 Review of Systems  Constitutional: Negative for fever.  HENT: Negative for drooling and facial swelling.   Musculoskeletal: Negative for neck pain.  Neurological: Negative for headaches.  All other systems reviewed and are negative.      Allergies  Hydrocodone bitartrate  Home Medications   Current Outpatient Rx  Name  Route  Sig  Dispense  Refill    . diphenhydramine-acetaminophen (TYLENOL PM EXTRA STRENGTH) 25-500 MG TABS   Oral   Take 2 tablets by mouth at bedtime as needed. For pain         . HYDROcodone-acetaminophen (LORTAB) 7.5-500 MG/15ML solution   Oral   Take 15 mLs by mouth every 6 (six) hours as needed for pain.   120 mL   0   . ibuprofen (ADVIL,MOTRIN) 200 MG tablet   Oral   Take 800 mg by mouth every 6 (six) hours as needed. Patient used this medication for her pain.         . methimazole (TAPAZOLE) 5 MG tablet   Oral   Take 5 mg by mouth 3 (three) times daily.          BP 136/97  Pulse 81  Temp(Src) 98.7 F (37.1 C) (Oral)  Resp 20  Ht 5\' 4"  (1.626 m)  Wt 280 lb (127.007 kg)  BMI 48.04 kg/m2  SpO2 100%  LMP 11/20/2013 Physical Exam  Nursing note and vitals reviewed. Constitutional: She is oriented to person, place, and time. She appears well-developed and well-nourished. No distress.  HENT:  Head: Normocephalic and atraumatic.  Right Ear: External ear normal.  Left Ear: External ear normal.  Nose: Nose normal.  Mouth/Throat: Oropharynx is clear and moist. No trismus in the jaw. Dental caries present. No oropharyngeal exudate.    Eyes:  Conjunctivae are normal. Pupils are equal, round, and reactive to light. No scleral icterus.  Pulmonary/Chest: Effort normal.  Musculoskeletal: Normal range of motion. She exhibits no edema and no tenderness.  Neurological: She is alert and oriented to person, place, and time. She exhibits normal muscle tone. Coordination normal.  Skin: Skin is warm and dry. No rash noted. No erythema. No pallor.  Psychiatric: She has a normal mood and affect. Her behavior is normal. Judgment and thought content normal.    ED Course  Dental Date/Time: 12/09/2013 8:55 PM Performed by: Marisue Humble, Meher Kucinski C. Authorized by: Patrecia Pour Consent: Verbal consent obtained. written consent not obtained. Risks and benefits: risks, benefits and alternatives were  discussed Consent given by: patient Patient understanding: patient states understanding of the procedure being performed Patient consent: the patient's understanding of the procedure does not match consent given Procedure consent: procedure consent does not match procedure scheduled Relevant documents: relevant documents present and verified Test results: test results not available Imaging studies: imaging studies not available Required items: required blood products, implants, devices, and special equipment available Patient identity confirmed: verbally with patient and arm band Time out: Immediately prior to procedure a "time out" was called to verify the correct patient, procedure, equipment, support staff and site/side marked as required. Preparation: Patient was prepped and draped in the usual sterile fashion. Local anesthesia used: no Patient sedated: no Patient tolerance: Patient tolerated the procedure well with no immediate complications. Comments: Patient given left lower inferior aveolar dental block   (including critical care time) Labs Review Labs Reviewed - No data to display Imaging Review No results found.   EKG Interpretation None      MDM   Dental pain Dental caries  Patient here with left lower 2nd molar dental pain - obvious caries - no trismus, no evidence of ludwigs angina.   Izola Price Marisue Humble, [place] 12/09/13 2057

## 2013-12-09 NOTE — ED Notes (Signed)
C/o left lower toothache x one week.

## 2013-12-10 NOTE — ED Provider Notes (Signed)
Medical screening examination/treatment/procedure(s) were performed by non-physician practitioner and as supervising physician I was immediately available for consultation/collaboration.   EKG Interpretation None        Audree CamelScott T Ervin Hensley, MD 12/10/13 1203

## 2014-02-19 ENCOUNTER — Emergency Department (HOSPITAL_BASED_OUTPATIENT_CLINIC_OR_DEPARTMENT_OTHER)
Admission: EM | Admit: 2014-02-19 | Discharge: 2014-02-19 | Disposition: A | Discharge status: Home / Self Care | Payer: Managed Care, Other (non HMO) | Attending: Emergency Medicine | Admitting: Emergency Medicine

## 2014-02-19 ENCOUNTER — Encounter (HOSPITAL_BASED_OUTPATIENT_CLINIC_OR_DEPARTMENT_OTHER): Payer: Self-pay | Admitting: Emergency Medicine

## 2014-02-19 DIAGNOSIS — Z792 Long term (current) use of antibiotics: Secondary | ICD-10-CM | POA: Insufficient documentation

## 2014-02-19 DIAGNOSIS — R0789 Other chest pain: Secondary | ICD-10-CM

## 2014-02-19 DIAGNOSIS — E079 Disorder of thyroid, unspecified: Secondary | ICD-10-CM | POA: Insufficient documentation

## 2014-02-19 DIAGNOSIS — R071 Chest pain on breathing: Secondary | ICD-10-CM | POA: Insufficient documentation

## 2014-02-19 DIAGNOSIS — E669 Obesity, unspecified: Secondary | ICD-10-CM | POA: Insufficient documentation

## 2014-02-19 DIAGNOSIS — Z79899 Other long term (current) drug therapy: Secondary | ICD-10-CM | POA: Insufficient documentation

## 2014-02-19 NOTE — ED Notes (Signed)
No old EKG found  

## 2014-02-19 NOTE — Discharge Instructions (Signed)
Rest, avoid heavy lifting or hard physical activity for the next few days. He may apply a heating pack and/or ice pack. Take ibuprofen and/or Tylenol every 6 hours as needed for pain.   Chest Wall Pain Chest wall pain is pain in or around the bones and muscles of your chest. It may take up to 6 weeks to get better. It may take longer if you must stay physically active in your work and activities.  CAUSES  Chest wall pain may happen on its own. However, it may be caused by:  A viral illness like the flu.  Injury.  Coughing.  Exercise.  Arthritis.  Fibromyalgia.  Shingles. HOME CARE INSTRUCTIONS   Avoid overtiring physical activity. Try not to strain or perform activities that cause pain. This includes any activities using your chest or your abdominal and side muscles, especially if heavy weights are used.  Put ice on the sore area.  Put ice in a plastic bag.  Place a towel between your skin and the bag.  Leave the ice on for 15-20 minutes per hour while awake for the first 2 days.  Only take over-the-counter or prescription medicines for pain, discomfort, or fever as directed by your caregiver. SEEK IMMEDIATE MEDICAL CARE IF:   Your pain increases, or you are very uncomfortable.  You have a fever.  Your chest pain becomes worse.  You have new, unexplained symptoms.  You have nausea or vomiting.  You feel sweaty or lightheaded.  You have a cough with phlegm (sputum), or you cough up blood. MAKE SURE YOU:   Understand these instructions.  Will watch your condition.  Will get help right away if you are not doing well or get worse. Document Released: 09/14/2005 Document Revised: 12/07/2011 Document Reviewed: 05/11/2011 [facility] Patient Information 2014 [place], [place].

## 2014-02-19 NOTE — ED Provider Notes (Signed)
CSN: [id number]     Arrival date & time 02/19/14  2117 History   First MD Initiated Contact with Patient 02/19/14 2130     Chief Complaint  Patient presents with  . Chest Pain     (Consider location/radiation/quality/duration/timing/severity/associated sxs/prior Treatment) HPI Comments: 28 year old obese female with a past medical history of thyroid disease presents to the emergency department complaining of right-sided chest pain x1 day. Patient states in the middle of the day today she randomly developed right-sided chest pain beginning underneath her right breast that radiates to her right shoulder. Pain worse when she moves her head, her arm or presses on her chest. She has not tried any alleviating factors for her symptoms. She cannot recall doing anything out of the ordinary, however states this feels like a muscle pain. Denies ever having pain like this in the past. Denies shortness of breath, nausea, vomiting, diaphoresis, fever, chills or cough.  Patient is a 28 y.o. female presenting with chest pain. The history is provided by the patient and the spouse.  Chest Pain   Past Medical History  Diagnosis Date  . Thyroid disease    Past Surgical History  Procedure Laterality Date  . Cesarean section    . Tubal ligation     No family history on file. History  Substance Use Topics  . Smoking status: Never Smoker   . Smokeless tobacco: Not on file  . Alcohol Use: No   OB History   Grav Para Term Preterm Abortions TAB SAB Ect Mult Living                 Review of Systems  Cardiovascular: Positive for chest pain.  All other systems reviewed and are negative.     Allergies  Hydrocodone bitartrate  Home Medications   Prior to Admission medications   Medication Sig Start Date End Date Taking? Authorizing Provider  amoxicillin (AMOXIL) 500 MG capsule Take 1 capsule (500 mg total) by mouth 3 (three) times daily. 12/09/13   Izola Price. Sanford, PA-C    diphenhydramine-acetaminophen (TYLENOL PM EXTRA STRENGTH) 25-500 MG TABS Take 2 tablets by mouth at bedtime as needed. For pain    Historical Provider, MD  HYDROcodone-acetaminophen (LORTAB) 7.5-500 MG/15ML solution Take 15 mLs by mouth every 6 (six) hours as needed for pain. 02/06/13   Glynn Octave, MD  ibuprofen (ADVIL,MOTRIN) 200 MG tablet Take 800 mg by mouth every 6 (six) hours as needed. Patient used this medication for her pain.    Historical Provider, MD  ibuprofen (ADVIL,MOTRIN) 800 MG tablet Take 1 tablet (800 mg total) by mouth 3 (three) times daily. 12/09/13   Izola Price. Sanford, PA-C  methimazole (TAPAZOLE) 5 MG tablet Take 5 mg by mouth 3 (three) times daily.    Historical Provider, MD   BP 120/83  Pulse 81  Temp(Src) 98.7 F (37.1 C) (Oral)  Resp 20  Ht 5\' 2"  (1.575 m)  Wt 280 lb (127.007 kg)  BMI 51.20 kg/m2  SpO2 100%  LMP 02/05/2014 Physical Exam  Nursing note and vitals reviewed. Constitutional: She is oriented to person, place, and time. She appears well-developed and well-nourished. No distress.  Obese.  HENT:  Head: Normocephalic and atraumatic.  Mouth/Throat: Oropharynx is clear and moist.  Eyes: Conjunctivae and EOM are normal. Pupils are equal, round, and reactive to light.  Neck: Normal range of motion. Neck supple. No JVD present.  Cardiovascular: Normal rate, regular rhythm, normal heart sounds and intact distal pulses.   No extremity  edema.  Pulmonary/Chest: Effort normal and breath sounds normal. No respiratory distress. She exhibits tenderness.    Abdominal: Soft. Bowel sounds are normal. There is no tenderness.  Musculoskeletal: Normal range of motion. She exhibits no edema.  Neurological: She is alert and oriented to person, place, and time. She has normal strength. No sensory deficit.  Speech fluent, goal oriented. Moves limbs without ataxia. Equal grip strength bilateral.  Skin: Skin is warm and dry. No rash noted. She is not diaphoretic.   Psychiatric: She has a normal mood and affect. Her behavior is normal.    ED Course  Procedures (including critical care time) Labs Review Labs Reviewed - No data to display  Imaging Review No results found.   EKG Interpretation None      MDM   Final diagnoses:  Chest wall pain   Pt presenting with chest pain. She is well appearing and in NAD. Afebrile, VSS. Chest pain reproducible. EKG without any acute findings. Doubt cardiac, low risk HEART score 1 and low suspicion. PERC negative. I discussed with patient having a chest pain workup, however given really low suspicion, pt prefers to hold on any further workup at this time. I advised her to take NSAIDs and avoid heavy lifting/hard physical activity over the next few days given musculoskeletal pain. Stable for d/c. F/u with PCP. Return precautions given. Patient states understanding of treatment care plan and is agreeable.  Trevor MaceRobyn M Albert, PA-C 02/19/14 2303

## 2014-02-19 NOTE — ED Notes (Signed)
Pain under her right breast with radiation into her shoulder. Pain increases when she turns her head.

## 2014-02-23 NOTE — ED Provider Notes (Signed)
Medical screening examination/treatment/procedure(s) were performed by non-physician practitioner and as supervising physician I was immediately available for consultation/collaboration.   EKG Interpretation   Date/Time:  Monday Feb 19 2014 21:35:31 EDT Ventricular Rate:  85 PR Interval:  142 QRS Duration: 102 QT Interval:  380 QTC Calculation: 452 R Axis:   20 Text Interpretation:  Normal sinus rhythm Left ventricular hypertrophy  Nonspecific T wave abnormality Abnormal ECG ED PHYSICIAN INTERPRETATION  AVAILABLE IN [facility] Confirmed by TEST, Record ([id number]) on  02/21/2014 7:12:19 AM        Audree Camel, MD 02/23/14 1557

## 2014-08-17 ENCOUNTER — Emergency Department (HOSPITAL_BASED_OUTPATIENT_CLINIC_OR_DEPARTMENT_OTHER)
Admission: EM | Admit: 2014-08-17 | Discharge: 2014-08-17 | Disposition: A | Discharge status: Home / Self Care | Payer: Managed Care, Other (non HMO) | Attending: Emergency Medicine | Admitting: Emergency Medicine

## 2014-08-17 ENCOUNTER — Encounter (HOSPITAL_BASED_OUTPATIENT_CLINIC_OR_DEPARTMENT_OTHER): Payer: Self-pay | Admitting: *Deleted

## 2014-08-17 DIAGNOSIS — J029 Acute pharyngitis, unspecified: Secondary | ICD-10-CM | POA: Diagnosis not present

## 2014-08-17 DIAGNOSIS — Z792 Long term (current) use of antibiotics: Secondary | ICD-10-CM | POA: Diagnosis not present

## 2014-08-17 DIAGNOSIS — Z8639 Personal history of other endocrine, nutritional and metabolic disease: Secondary | ICD-10-CM | POA: Insufficient documentation

## 2014-08-17 DIAGNOSIS — Z791 Long term (current) use of non-steroidal anti-inflammatories (NSAID): Secondary | ICD-10-CM | POA: Insufficient documentation

## 2014-08-17 LAB — RAPID STREP SCREEN (MED CTR MEBANE ONLY): STREPTOCOCCUS, GROUP A SCREEN (DIRECT): NEGATIVE

## 2014-08-17 MED ORDER — HYDROCODONE-ACETAMINOPHEN 7.5-500 MG/15ML PO SOLN: 15.0000 mL | Freq: Four times a day (QID) | ORAL | Status: DC | PRN

## 2014-08-17 NOTE — ED Provider Notes (Signed)
CSN: [id number]     Arrival date & time 08/17/14  1825 History  This chart was scribed for non-physician practitioner working with Hurman HornJohn M Bednar, MD by Elveria Risingimelie Horne, ED Scribe. This patient was seen in room MH02/MH02 and the patient's care was started at 9:31 PM.   Chief Complaint  Patient presents with  . Sore Throat    The history is provided by the patient. No language interpreter was used.   HPI Comments: Wendy Gordon is a 28 y.o. female who presents to the Emergency Department complaining of sore throat ongoing for two days now. Patient reports associated ear pain, coughing headache, and chills. Patient reports exacerbated pain with swallowing. Patient reports productive sputum described as thick green mucous. Patient reports frequent history of Strep throat; she describes her currently symptoms similarly.     Past Medical History  Diagnosis Date  . Thyroid disease    Past Surgical History  Procedure Laterality Date  . Cesarean section    . Tubal ligation     No family history on file. History  Substance Use Topics  . Smoking status: Never Smoker   . Smokeless tobacco: Not on file  . Alcohol Use: No   OB History    No data available     Review of Systems  Constitutional: Positive for chills. Negative for fever.  HENT: Positive for ear pain and sore throat. Negative for trouble swallowing and voice change.   Respiratory: Positive for cough.   Neurological: Positive for headaches.      Allergies  Hydrocodone bitartrate  Home Medications   Prior to Admission medications   Medication Sig Start Date End Date Taking? Authorizing Provider  amoxicillin (AMOXIL) 500 MG capsule Take 1 capsule (500 mg total) by mouth 3 (three) times daily. 12/09/13   Izola PriceFrances C. Sanford, PA-C  diphenhydramine-acetaminophen (TYLENOL PM EXTRA STRENGTH) 25-500 MG TABS Take 2 tablets by mouth at bedtime as needed. For pain    Historical Provider, MD  HYDROcodone-acetaminophen (LORTAB)  7.5-500 MG/15ML solution Take 15 mLs by mouth every 6 (six) hours as needed for pain. 02/06/13   Glynn OctaveStephen Rancour, MD  ibuprofen (ADVIL,MOTRIN) 200 MG tablet Take 800 mg by mouth every 6 (six) hours as needed. Patient used this medication for her pain.    Historical Provider, MD  ibuprofen (ADVIL,MOTRIN) 800 MG tablet Take 1 tablet (800 mg total) by mouth 3 (three) times daily. 12/09/13   Izola PriceFrances C. Sanford, PA-C  methimazole (TAPAZOLE) 5 MG tablet Take 5 mg by mouth 3 (three) times daily.    Historical Provider, MD   Triage Vitals: BP 179/99 mmHg  Pulse 87  Temp(Src) 98.1 F (36.7 C) (Oral)  Resp 18  Ht 5\' 2"  (1.575 m)  Wt 250 lb (113.399 kg)  BMI 45.71 kg/m2  SpO2 100%  LMP 07/22/2014 Physical Exam  Constitutional: She is oriented to person, place, and time. She appears well-developed and well-nourished. No distress.  HENT:  Head: Normocephalic and atraumatic.  Right TM mildly erythematous, L TM normal. Nares normal. uvula midline. Tonsillar enlargement and exudate. No trismus. Cervical lympadenopathy  Eyes: EOM are normal.  Neck: Neck supple. No tracheal deviation present.  Cardiovascular: Normal rate.   Pulmonary/Chest: Effort normal. No respiratory distress.  Musculoskeletal: Normal range of motion.  Neurological: She is alert and oriented to person, place, and time.  Skin: Skin is warm and dry.  Psychiatric: She has a normal mood and affect. Her behavior is normal.  Nursing note and vitals reviewed.  ED Course  Procedures (including critical care time)  COORDINATION OF CARE: 9:20 PM- Discussed treatment plan with patient at bedside and patient agreed to plan.   9:42 PM Strep negative, culture sent.  Will treat sxs.  ENT referral as needed.   Labs Review Labs Reviewed  RAPID STREP SCREEN  CULTURE, GROUP A STREP    Imaging Review No results found.   EKG Interpretation None      MDM   Final diagnoses:  Pharyngitis    BP 179/99 mmHg  Pulse 87  Temp(Src)  98.1 F (36.7 C) (Oral)  Resp 18  Ht 5\' 2"  (1.575 m)  Wt 250 lb (113.399 kg)  BMI 45.71 kg/m2  SpO2 100%  LMP 07/22/2014 BP elevated, pt recommend to have it recheck by PCP in 1 week.  I personally performed the services described in this documentation, which was scribed in my presence. The recorded information has been reviewed and is accurate.    Fayrene HelperBowie Wanza Szumski, PA-C 08/17/14 2143  Hurman HornJohn M Bednar, MD 08/23/14 [phone]

## 2014-08-17 NOTE — Discharge Instructions (Signed)

## 2014-08-17 NOTE — ED Notes (Signed)
Pt reports ear, throat, and head pain x 2 days

## 2014-08-19 LAB — CULTURE, GROUP A STREP

## 2016-06-30 ENCOUNTER — Emergency Department (INDEPENDENT_AMBULATORY_CARE_PROVIDER_SITE_OTHER): Payer: Managed Care, Other (non HMO)

## 2016-06-30 ENCOUNTER — Encounter: Payer: Self-pay | Admitting: *Deleted

## 2016-06-30 ENCOUNTER — Emergency Department (INDEPENDENT_AMBULATORY_CARE_PROVIDER_SITE_OTHER)
Admission: EM | Admit: 2016-06-30 | Discharge: 2016-06-30 | Disposition: A | Discharge status: Home / Self Care | Payer: Managed Care, Other (non HMO) | Source: Home / Self Care | Attending: Family Medicine | Admitting: Family Medicine

## 2016-06-30 DIAGNOSIS — M79672 Pain in left foot: Secondary | ICD-10-CM

## 2016-06-30 DIAGNOSIS — M79642 Pain in left hand: Secondary | ICD-10-CM | POA: Diagnosis not present

## 2016-06-30 HISTORY — DX: Essential (primary) hypertension: I10

## 2016-06-30 MED ORDER — ACETAMINOPHEN 325 MG PO TABS
650.0000 mg | ORAL_TABLET | Freq: Once | ORAL | Status: AC
  Administered 2016-06-30: 650 mg via ORAL

## 2016-06-30 MED ORDER — IBUPROFEN 600 MG PO TABS: 600.0000 mg | ORAL_TABLET | Freq: Four times a day (QID) | ORAL | 0 refills | Status: DC | PRN

## 2016-06-30 MED ORDER — DIPHENHYDRAMINE HCL 12.5 MG/5ML PO ELIX
25.0000 mg | ORAL_SOLUTION | Freq: Once | ORAL | Status: AC
  Administered 2016-06-30: 25 mg via ORAL

## 2016-06-30 NOTE — ED Triage Notes (Signed)
Pt c/o sudden LT hand pain, swelling and hot to touch x 30 minutes. Denies insect bite or injury. No OTC meds.

## 2016-06-30 NOTE — ED Provider Notes (Signed)
CSN: [id number]     Arrival date & time 06/30/16  1019 History   First MD Initiated Contact with Patient 06/30/16 1038     Chief Complaint  Patient presents with  . Hand Pain   (Consider location/radiation/quality/duration/timing/severity/associated sxs/prior Treatment) HPI  Wendy Gordon is a 30 y.o. female presenting to UC with c/o sudden onset severe Left hand pain, swelling and redness while she was at work about 30 minutes PTA.  Pain is a sharp burning sensation, 6/10. No pain medication taken PTA.  Pt denies injury or insect bite.  She was typing when symptoms started. She is Right hand dominant. No prior hx of similar pain. Denies hx of gout.    Past Medical History:  Diagnosis Date  . Hypertension   . Thyroid disease    Past Surgical History:  Procedure Laterality Date  . CESAREAN SECTION    . CHOLECYSTECTOMY    . TUBAL LIGATION     History reviewed. No pertinent family history. Social History  Substance Use Topics  . Smoking status: Never Smoker  . Smokeless tobacco: Never Used  . Alcohol use No   OB History    No data available     Review of Systems  Musculoskeletal: Positive for arthralgias, joint swelling and myalgias.       Left hand  Skin: Negative for color change, rash and wound.  Neurological: Negative for weakness and numbness.    Allergies  Hydrocodone bitartrate  Home Medications   Prior to Admission medications   Medication Sig Start Date End Date Taking? Authorizing Provider  hydrochlorothiazide (HYDRODIURIL) 25 MG tablet Take 25 mg by mouth daily.   Yes Historical Provider, MD  ibuprofen (ADVIL,MOTRIN) 600 MG tablet Take 1 tablet (600 mg total) by mouth every 6 (six) hours as needed. 06/30/16   Junius Finner, PA-C   Meds Ordered and Administered this Visit   Medications  acetaminophen (TYLENOL) tablet 650 mg (650 mg Oral Given 06/30/16 1040)  diphenhydrAMINE (BENADRYL) 12.5 MG/5ML elixir 25 mg (25 mg Oral Given 06/30/16 1104)    BP 144/90  (BP Location: Left Arm)   Pulse 78   Temp 98.2 F (36.8 C) (Oral)   Resp 18   Ht 5\' 3"  (1.6 m)   Wt (!) 308 lb (139.7 kg)   LMP 05/28/2016   SpO2 100%   BMI 54.56 kg/m  No data found.   Physical Exam  Constitutional: She is oriented to person, place, and time. She appears well-developed and well-nourished.  HENT:  Head: Normocephalic and atraumatic.  Eyes: EOM are normal.  Neck: Normal range of motion.  Cardiovascular: Normal rate.   Pulmonary/Chest: Effort normal.  Musculoskeletal: Normal range of motion. She exhibits tenderness. She exhibits no edema.  Left hand: no obvious edema. Full ROM wrist and fingers. Tenderness to light touch to dorsal aspect between thumb and index finger.   Neurological: She is alert and oriented to person, place, and time.  Skin: Skin is warm and dry. No rash noted. No erythema.  Left hand: pt is dark skinned. No appreciated erythema or rash. No warmth. Tenderness with light touch to dorsal aspect between thumb and index finger. No induration or fluctuance.   Psychiatric: She has a normal mood and affect. Her behavior is normal.  Nursing note and vitals reviewed.   Urgent Care Course   Clinical Course    Procedures (including critical care time)  Labs Review Labs Reviewed - No data to display  Imaging Review Dg Hand Complete Left  Result Date: 06/30/2016 CLINICAL DATA:  Left hand pain. EXAM: LEFT HAND - COMPLETE 3+ VIEW COMPARISON:  None. FINDINGS: There is no evidence of fracture or dislocation. There is no evidence of arthropathy or other focal bone abnormality. Soft tissues are unremarkable. IMPRESSION: Negative. Electronically Signed   By: Signa Kellaylor  Stroud M.D.   On: 06/30/2016 10:56     MDM   1. Left hand pain    Sudden onset severe pain and reported redness, swelling and warmth to Left hand. Tenderness to light touch. No redness, warmth, or edema noted on exam.  Plain films: normal, no soft tissue swelling or bony abnormalities.    Due to sudden onset of symptoms, question if pt was bit/stung by a small insect. Pt given acetaminophen, benadryl, and ice pack in UC.  No evidence of underlying infection at this time. Encouraged alternating cool and warm compresses for comfort. Acetaminophen and ibuprofen as well for pain. May try benadryl for discomfort or itching that may develop. F/u in 3-4 days if not improving, sooner if worsening.    Junius Finnerrin O'Malley, PA-C 06/30/16 1149

## 2016-06-30 NOTE — Discharge Instructions (Signed)
°  Your x-ray today appeared normal and your exam was not concerning for a skin infection at this time.  Pain could have been from a small insect bite/sting.  You may try alternating acetaminophen and ibuprofen as well as cool and warm compresses to help with pain.  Because there is a possibility of an insect bite, you may also take benadryl to help with discomfort.    If you continue to have worsening pain, redness, swelling, or develop fever, or other new symptoms over the next few days it is recommended you be re-seen.   If symptoms not improving in 3-4 days please follow up with your primary care provider or return to urgent care if needed.

## 2017-06-07 ENCOUNTER — Emergency Department (HOSPITAL_BASED_OUTPATIENT_CLINIC_OR_DEPARTMENT_OTHER)
Admission: EM | Admit: 2017-06-07 | Discharge: 2017-06-07 | Disposition: A | Discharge status: Home / Self Care | Payer: Managed Care, Other (non HMO) | Attending: Emergency Medicine | Admitting: Emergency Medicine

## 2017-06-07 ENCOUNTER — Encounter (HOSPITAL_BASED_OUTPATIENT_CLINIC_OR_DEPARTMENT_OTHER): Payer: Self-pay | Admitting: Emergency Medicine

## 2017-06-07 DIAGNOSIS — S39012A Strain of muscle, fascia and tendon of lower back, initial encounter: Secondary | ICD-10-CM

## 2017-06-07 DIAGNOSIS — X509XXA Other and unspecified overexertion or strenuous movements or postures, initial encounter: Secondary | ICD-10-CM | POA: Diagnosis not present

## 2017-06-07 DIAGNOSIS — Y999 Unspecified external cause status: Secondary | ICD-10-CM | POA: Insufficient documentation

## 2017-06-07 DIAGNOSIS — I10 Essential (primary) hypertension: Secondary | ICD-10-CM | POA: Diagnosis not present

## 2017-06-07 DIAGNOSIS — S3992XA Unspecified injury of lower back, initial encounter: Secondary | ICD-10-CM | POA: Diagnosis present

## 2017-06-07 DIAGNOSIS — Y929 Unspecified place or not applicable: Secondary | ICD-10-CM | POA: Insufficient documentation

## 2017-06-07 DIAGNOSIS — Y939 Activity, unspecified: Secondary | ICD-10-CM | POA: Insufficient documentation

## 2017-06-07 LAB — URINALYSIS, ROUTINE W REFLEX MICROSCOPIC
BILIRUBIN URINE: NEGATIVE
Glucose, UA: NEGATIVE mg/dL
Ketones, ur: NEGATIVE mg/dL
Nitrite: NEGATIVE
Protein, ur: NEGATIVE mg/dL
Specific Gravity, Urine: >1.03 — ABNORMAL HIGH (ref 1.005–1.030)
pH: 6 (ref 5.0–8.0)

## 2017-06-07 LAB — PREGNANCY, URINE: Preg Test, Ur: NEGATIVE

## 2017-06-07 LAB — URINALYSIS, MICROSCOPIC (REFLEX)

## 2017-06-07 MED ORDER — CYCLOBENZAPRINE HCL 10 MG PO TABS: 10.0000 mg | ORAL_TABLET | Freq: Two times a day (BID) | ORAL | 0 refills | Status: AC | PRN

## 2017-06-07 MED ORDER — IBUPROFEN 800 MG PO TABS: 800.0000 mg | ORAL_TABLET | Freq: Three times a day (TID) | ORAL | 0 refills | Status: DC

## 2017-06-07 NOTE — ED Triage Notes (Signed)
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

## 2017-06-07 NOTE — Discharge Instructions (Signed)
Medications: Flexeril, ibuprofen  Treatment: Take Flexeril twice daily as needed for muscle pain or spasms. Do not drive or operate machinery when taking this medication. Take ibuprofen as prescribed and alternate with Tylenol as prescribed over-the-counter. Use ice and heat alternating 20 minutes on, 20 minutes off. Attempt the stretches as tolerated 1-2 times daily.  Follow-up: Please follow-up with your doctor if symptoms are not improving over the next 7-10 days. Please return to the emergency department if you develop any new or worsening symptoms.

## 2017-06-07 NOTE — ED Provider Notes (Signed)
[facility] Provider [facility]   CSN: [id number] Arrival date & time: 06/07/17  1911     History   Chief Complaint Chief Complaint  Patient presents with  . Back Pain    HPI Wendy Gordon is a 31 y.o. female with history of hypertension, thyroid disease, who presents with a three-day history of right low back pain. 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 Patient was then on her feet a lot at church on Sunday and she woke up today with severe right, low back pain. It is worse with movement. She has had some radiation of pain to her upper, right posterior thigh. She denies numbness or tingling, saddle anesthesia, loss of bowel or bladder control, fever, history of back problems, history procedure to back, known cancer, history of IVDU, or urinary symptoms. Patient has been taking Tylenol without significant relief of her symptoms.  HPI  Past Medical History:  Diagnosis Date  . Hypertension   . Thyroid disease     There are no active problems to display for this patient.   Past Surgical History:  Procedure Laterality Date  . CESAREAN SECTION    . CHOLECYSTECTOMY    . TUBAL LIGATION      OB History    No data available       Home Medications    Prior to Admission medications   Medication Sig Start Date End Date Taking? Authorizing Provider  cyclobenzaprine (FLEXERIL) 10 MG tablet Take 1 tablet (10 mg total) by mouth 2 (two) times daily as needed for muscle spasms. 06/07/17   Kelli Egolf, Waylan Boga, PA-C  hydrochlorothiazide (HYDRODIURIL) 25 MG tablet Take 25 mg by mouth daily.    [provider]  ibuprofen (ADVIL,MOTRIN) 800 MG tablet Take 1 tablet (800 mg total) by mouth 3 (three) times daily. 06/07/17   Emi Holes, PA-C    Family History History reviewed. No pertinent family history.  Social History Social History  Substance Use Topics  . Smoking status: Never Smoker  . Smokeless tobacco: Never Used  .  Alcohol use No     Allergies   Hydrocodone bitartrate   Review of Systems Review of Systems  Constitutional: Negative for chills and fever.  HENT: Negative for facial swelling and sore throat.   Respiratory: Negative for shortness of breath.   Cardiovascular: Negative for chest pain.  Gastrointestinal: Negative for abdominal pain, nausea and vomiting.  Genitourinary: Negative for dysuria.  Musculoskeletal: Positive for back pain. Negative for neck pain.  Skin: Negative for rash and wound.  Neurological: Negative for numbness and headaches.  Psychiatric/Behavioral: The patient is not nervous/anxious.      Physical Exam Updated Vital Signs BP (!) 126/95 (BP Location: Left Arm)   Pulse 84   Temp 98.7 F (37.1 C) (Oral)   Resp 18   Ht  (1.575 m)   Wt 124.7 kg (275 lb)   SpO2 100%   BMI 50.30 kg/m   Physical Exam  Constitutional: She appears well-developed and well-nourished. No distress.  HENT:  Head: Normocephalic and atraumatic.  Mouth/Throat: Oropharynx is clear and moist. No oropharyngeal exudate.  Eyes: Pupils are equal, round, and reactive to light. Conjunctivae are normal. Right eye exhibits no discharge. Left eye exhibits no discharge. No scleral icterus.  Neck: Normal range of motion. Neck supple. No thyromegaly present.  Cardiovascular: Normal rate, regular rhythm, normal heart sounds and intact distal pulses.  Exam reveals no gallop and no friction rub.  No murmur heard. Pulmonary/Chest: Effort normal and breath sounds normal. No stridor. No respiratory distress. She has no wheezes. She has no rales.  Abdominal: Soft. Bowel sounds are normal. She exhibits no distension. There is no tenderness. There is no rebound and no guarding.  Musculoskeletal: She exhibits no edema.  R lumbar paraspinal tenderness to right upper glute; mild lumbar midline tenderness  Lymphadenopathy:    She has no cervical adenopathy.  Neurological: She is alert. Coordination  normal.  Normal sensation and 5/5 strength to lower extremities, 2+ patellar reflexes  Skin: Skin is warm and dry. No rash noted. She is not diaphoretic. No pallor.  Psychiatric: She has a normal mood and affect.  Nursing note and vitals reviewed.    ED Treatments / Results  Labs (all labs ordered are listed, but only abnormal results are displayed) Labs Reviewed  URINALYSIS, ROUTINE W REFLEX MICROSCOPIC - Abnormal; Notable for the following:       Result Value   APPearance CLOUDY (*)    Specific Gravity, Urine >1.030 (*)    Hgb urine dipstick SMALL (*)    Leukocytes, UA TRACE (*)    All other components within normal limits  URINALYSIS, MICROSCOPIC (REFLEX) - Abnormal; Notable for the following:    Bacteria, UA FEW (*)    Squamous Epithelial / LPF 6-30 (*)    All other components within normal limits  PREGNANCY, URINE    EKG  EKG Interpretation None       Radiology No results found.  Procedures Procedures (including critical care time)  Medications Ordered in ED Medications - No data to display   Initial Impression / Assessment and Plan / ED Course  I have reviewed the triage vital signs and the nursing notes.  Pertinent labs & imaging results that were available during my care of the patient were reviewed by me and considered in my medical decision making (see chart for details).     Patient with back pain.  No neurological deficits and normal neuro exam.  Patient is ambulatory.  No loss of bowel or bladder control.  No concern for cauda equina.  No fever, night sweats, weight loss, h/o cancer, IVDA, no recent procedure to back. No urinary symptoms suggestive of UTI. I offered x-ray considering mild midline tenderness, however patient declined at this time. Patient advised follow up with PCP if symptoms are not improving over the next 7-10 days. Supportive care and return precaution discussed. Patient understands and agrees with plan. Patient vitals stable  throughout ED course and discharged in satisfactory condition.   Final Clinical Impressions(s) / ED Diagnoses   Final diagnoses:  Strain of lumbar region, initial encounter    New Prescriptions New Prescriptions   CYCLOBENZAPRINE (FLEXERIL) 10 MG TABLET    Take 1 tablet (10 mg total) by mouth 2 (two) times daily as needed for muscle spasms.   IBUPROFEN (ADVIL,MOTRIN) 800 MG TABLET    Take 1 tablet (800 mg total) by mouth 3 (three) times daily.     Emi HolesLaw, Cleopatra Sardo M, PA-C 06/07/17 2140    Loren RacerYelverton, David, MD 06/11/17 [phone]

## 2017-06-07 NOTE — ED Notes (Signed)
Pt verbalizes understanding of d/c instructions and denies any further needs at this time. 

## 2018-07-27 ENCOUNTER — Other Ambulatory Visit: Payer: Self-pay

## 2018-07-27 ENCOUNTER — Encounter (HOSPITAL_BASED_OUTPATIENT_CLINIC_OR_DEPARTMENT_OTHER): Payer: Self-pay | Admitting: Emergency Medicine

## 2018-07-27 ENCOUNTER — Emergency Department (HOSPITAL_BASED_OUTPATIENT_CLINIC_OR_DEPARTMENT_OTHER)
Admission: EM | Admit: 2018-07-27 | Discharge: 2018-07-27 | Disposition: A | Discharge status: Home / Self Care | Payer: Managed Care, Other (non HMO) | Attending: Emergency Medicine | Admitting: Emergency Medicine

## 2018-07-27 DIAGNOSIS — M5442 Lumbago with sciatica, left side: Secondary | ICD-10-CM | POA: Diagnosis not present

## 2018-07-27 DIAGNOSIS — Z79899 Other long term (current) drug therapy: Secondary | ICD-10-CM | POA: Diagnosis not present

## 2018-07-27 DIAGNOSIS — I10 Essential (primary) hypertension: Secondary | ICD-10-CM | POA: Diagnosis not present

## 2018-07-27 DIAGNOSIS — R1032 Left lower quadrant pain: Secondary | ICD-10-CM | POA: Diagnosis present

## 2018-07-27 MED ORDER — NAPROXEN 500 MG PO TABS: 500.0000 mg | ORAL_TABLET | Freq: Two times a day (BID) | ORAL | 0 refills | Status: DC

## 2018-07-27 MED ORDER — METHOCARBAMOL 500 MG PO TABS: 500.0000 mg | ORAL_TABLET | Freq: Two times a day (BID) | ORAL | 0 refills | Status: DC

## 2018-07-27 MED ORDER — METHOCARBAMOL 500 MG PO TABS
500.0000 mg | ORAL_TABLET | Freq: Once | ORAL | Status: AC
  Administered 2018-07-27: 500 mg via ORAL
  Filled 2018-07-27: qty 1

## 2018-07-27 MED ORDER — PREDNISONE 50 MG PO TABS
60.0000 mg | ORAL_TABLET | Freq: Once | ORAL | Status: AC
  Administered 2018-07-27: 11:00:00 60 mg via ORAL
  Filled 2018-07-27: qty 1

## 2018-07-27 MED ORDER — PREDNISONE 50 MG PO TABS: 50.0000 mg | ORAL_TABLET | Freq: Every day | ORAL | 0 refills | Status: AC

## 2018-07-27 MED ORDER — NAPROXEN 250 MG PO TABS
500.0000 mg | ORAL_TABLET | Freq: Once | ORAL | Status: AC
  Administered 2018-07-27: 500 mg via ORAL
  Filled 2018-07-27: qty 2

## 2018-07-27 MED FILL — METHOCARBAMOL 500 MG TABLET: 500 | 10 days supply | Qty: 20 | Fill #0

## 2018-07-27 MED FILL — NAPROXEN 500 MG TABLET: 500 | 15 days supply | Qty: 30 | Fill #0

## 2018-07-27 MED FILL — predniSONE 50 MG TABS: 50 | 5 days supply | Qty: 5 | Fill #0

## 2018-07-27 NOTE — ED Notes (Signed)
Pt/family verbalized understanding of discharge instructions.   

## 2018-07-27 NOTE — Discharge Instructions (Signed)
You were seen here today for Back Pain: Low back pain is discomfort in the lower back that may be due to injuries to muscles and ligaments around the spine. Occasionally, it may be caused by a problem to a part of the spine called a disc. Your back pain should be treated with medicines listed below as well as back exercises and this back pain should get better over the next 2 weeks. Most patients get completely well in 4 weeks. It is important to know however, if you develop severe or worsening pain, low back pain with fever, numbness, weakness or inability to walk or urinate, you should return to the ER immediately.  Please follow up with your doctor this week for a recheck if still having symptoms.  HOME INSTRUCTIONS Self - care:  The application of heat can help soothe the pain.  Maintaining your daily activities, including walking (this is encouraged), as it will help you get better faster than just staying in bed. Do not life, push, pull anything more than 10 pounds for the next week. I am attaching back exercises that you can do at home to help facilitate your recovery.   Back Exercises - I have attached a handout on back exercises that can be done at home to help facilitate your recovery.   Medications are also useful to help with pain control.  You can use ice and heat, as well as over-the-counter agents such as salon pas lidocaine patches, or Biofreeze gel.  Acetaminophen.  This medication is generally safe, and found over the counter. Take as directed for your age. You should not take more than 8 of the extra strength (500mg ) pills a day (max dose is 4000mg  total OVER one day)  Non steroidal anti inflammatory: This includes medications including Ibuprofen, naproxen and Mobic; These medications help both pain and swelling and are very useful in treating back pain.  They should be taken with food, as they can cause stomach upset, and more seriously, stomach bleeding. Do not combine the  medications.   Muscle relaxants:  These medications can help with muscle tightness that is a cause of lower back pain.  Most of these medications can cause drowsiness, and it is not safe to drive or use dangerous machinery while taking them. They are primarily helpful when taken at night before sleep.  Prednisone - This is an oral steroid.  This medication is best taken with food in the morning.  Please note that this medication can cause anxiety, mood swings, muscle fatigue, increased hunger, weight gain (sodium/fluid retention), poor sleep as well as other symptoms. If you are a diabetic, please monitor your blood sugars at home as this medication can increase your blood sugars. Call your pharmacist if you have any questions.  You will need to follow up with your primary healthcare provider or Dr. Pearletha Forge with sports medicine in 1-2 weeks for reassessment and persistent symptoms.  Be aware that if you develop new symptoms, such as a fever, leg weakness, difficulty with or loss of control of your urine or bowels, abdominal pain, or more severe pain, you will need to seek medical attention and/or return to the Emergency department. Additional Information:  Your vital signs today were: BP 124/80 (BP Location: Right Arm)    Pulse 72    Temp 97.9 F (36.6 C) (Oral)    Resp 18    Ht 5\' 2"  (1.575 m)    Wt 120.2 kg    SpO2 100%  BMI 48.47 kg/m  If your blood pressure (BP) was elevated above 135/85 this visit, please have this repeated by your doctor within one month. ---------------

## 2018-07-27 NOTE — ED Triage Notes (Signed)
Pt c/o severe lower back pain radiating to LLE; on and off x 2wks, but much worse since last pm

## 2018-07-27 NOTE — ED Provider Notes (Signed)
[facility] EMERGENCY DEPARTMENT Provider Note   CSN: [id number] Arrival date & time: 07/27/18  1030     History   Chief Complaint Chief Complaint  Patient presents with  . Back Pain    HPI Wendy Gordon is a 32 y.o. female.  Wendy Gordon is a 32 y.o. Female with a history of hypertension, thyroid disease and tubal ligation, presents to the emergency department for evaluation of left lower back pain radiating into the left lower extremity.  Symptoms have been present on and off for the past 2 weeks but last night became more severe and persistent.  She reports that the muscles on the side of her left lower back feels like there in a tight knot and pain shoots down the back of her lower leg.  She denies any associated numbness, tingling or weakness.  No loss of bowel or bladder control and no saddle anesthesia.  No associated abdominal pain, nausea, vomiting, dysuria or urinary frequency.  No fevers or chills.  No history of cancer or IV drug use.  Patient has taken Tylenol PM for her symptoms which was helping at first but has not helped since pain worsened last night, has not tried anything else to treat her symptoms.     Past Medical History:  Diagnosis Date  . Hypertension   . Thyroid disease     There are no active problems to display for this patient.   Past Surgical History:  Procedure Laterality Date  . CESAREAN SECTION    . CHOLECYSTECTOMY    . TUBAL LIGATION       OB History   None      Home Medications    Prior to Admission medications   Medication Sig Start Date End Date Taking? Authorizing Provider  cyclobenzaprine (FLEXERIL) 10 MG tablet Take 1 tablet (10 mg total) by mouth 2 (two) times daily as needed for muscle spasms. 06/07/17   Law, Waylan Boga, PA-C  hydrochlorothiazide (HYDRODIURIL) 25 MG tablet Take 25 mg by mouth daily.    [provider]  ibuprofen (ADVIL,MOTRIN) 800 MG tablet Take 1 tablet (800 mg total) by mouth 3  (three) times daily. 06/07/17   Law, Waylan Boga, PA-C  methocarbamol (ROBAXIN) 500 MG tablet Take 1 tablet (500 mg total) by mouth 2 (two) times daily. 07/27/18   Dartha Lodge, PA-C  naproxen (NAPROSYN) 500 MG tablet Take 1 tablet (500 mg total) by mouth 2 (two) times daily. 07/27/18   Dartha Lodge, PA-C  predniSONE (DELTASONE) 50 MG tablet Take 1 tablet (50 mg total) by mouth daily for 5 days. 07/27/18 08/01/18  Dartha Lodge, PA-C    Family History No family history on file.  Social History Social History   Tobacco Use  . Smoking status: Never Smoker  . Smokeless tobacco: Never Used  Substance Use Topics  . Alcohol use: No  . Drug use: No     Allergies   Hydrocodone bitartrate   Review of Systems Review of Systems  Constitutional: Negative for chills and fever.  HENT: Negative.   Respiratory: Negative for shortness of breath.   Cardiovascular: Negative for chest pain.  Gastrointestinal: Negative for abdominal pain, constipation, diarrhea, nausea and vomiting.  Genitourinary: Negative for dysuria, flank pain, frequency and hematuria.  Musculoskeletal: Positive for back pain. Negative for arthralgias, gait problem, joint swelling, myalgias and neck pain.  Skin: Negative for color change, rash and wound.  Neurological: Negative for weakness and numbness.  Physical Exam Updated Vital Signs BP 124/80 (BP Location: Right Arm)   Pulse 72   Temp 97.9 F (36.6 C) (Oral)   Resp 18   Ht 5\' 2"  (1.575 m)   Wt 120.2 kg   SpO2 100%   BMI 48.47 kg/m   Physical Exam  Constitutional: She is oriented to person, place, and time. She appears well-developed and well-nourished. No distress.  HENT:  Head: Atraumatic.  Eyes: Right eye exhibits no discharge. Left eye exhibits no discharge.  Neck: Neck supple.  Cardiovascular:  Pulses:      Radial pulses are 2+ on the right side, and 2+ on the left side.       Dorsalis pedis pulses are 2+ on the right side, and 2+ on the  left side.       Posterior tibial pulses are 2+ on the right side, and 2+ on the left side.  Pulmonary/Chest: Effort normal. No respiratory distress.  Abdominal: Soft. Bowel sounds are normal. She exhibits no distension and no mass. There is no tenderness. There is no guarding.  Abdomen soft, nondistended, nontender to palpation in all quadrants without guarding or peritoneal signs, no CVA tenderness bilaterally  Musculoskeletal:  Tenderness to palpation over the left lower back, no focal midline tenderness.  Pain made worse with range of motion of the lower extremities, positive straight leg raise on the left.  Neurological: She is alert and oriented to person, place, and time.  Alert, clear speech, following commands. Moving all extremities without difficulty. Bilateral lower extremities with 5/5 strength in proximal and distal muscle groups and with dorsi and plantar flexion. Sensation intact in bilateral lower extremities. 2+ patellar DTRs bilaterally. Ambulatory with steady gait  Skin: Skin is warm and dry. Capillary refill takes less than 2 seconds. She is not diaphoretic.  Psychiatric: She has a normal mood and affect. Her behavior is normal.  Nursing note and vitals reviewed.    ED Treatments / Results  Labs (all labs ordered are listed, but only abnormal results are displayed) Labs Reviewed - No data to display  EKG None  Radiology No results found.  Procedures Procedures (including critical care time)  Medications Ordered in ED Medications  naproxen (NAPROSYN) tablet 500 mg (500 mg Oral Given 07/27/18 1124)  predniSONE (DELTASONE) tablet 60 mg (60 mg Oral Given 07/27/18 1124)  methocarbamol (ROBAXIN) tablet 500 mg (500 mg Oral Given 07/27/18 1124)     Initial Impression / Assessment and Plan / ED Course  I have reviewed the triage vital signs and the nursing notes.  Pertinent labs & imaging results that were available during my care of the patient were reviewed  by me and considered in my medical decision making (see chart for details).  Normal neurological exam, no evidence of urinary incontinence or retention, pain is consistently reproducible. There is no evidence of AAA or concern for dissection at this time.   Patient can walk but states is painful.  No loss of bowel or bladder control.  No concern for cauda equina.  No fever, night sweats, weight loss, h/o cancer, IVDU.  Pain treated here in the department with adequate improvement. RICE protocol and pain medicine indicated and discussed with patient. I have also discussed reasons to return immediately to the ER.  Patient expresses understanding and agrees with plan.   Final Clinical Impressions(s) / ED Diagnoses   Final diagnoses:  Acute left-sided low back pain with left-sided sciatica    ED Discharge Orders  Ordered    naproxen (NAPROSYN) 500 MG tablet  2 times daily     07/27/18 1130    predniSONE (DELTASONE) 50 MG tablet  Daily     07/27/18 1130    methocarbamol (ROBAXIN) 500 MG tablet  2 times daily     07/27/18 1130           Dartha Lodge, [place] 07/27/18 1131    Benjiman Core, MD 07/27/18 1517

## 2019-07-21 MED ORDER — ONDANSETRON HCL 4 MG/2ML IJ SOLN
4.00 | INTRAMUSCULAR | Status: DC
Start: ? — End: 2019-07-21

## 2019-07-21 MED ORDER — KCL IN DEXTROSE-NACL 20-5-0.45 MEQ/L-%-% IV SOLN
INTRAVENOUS | Status: DC
Start: ? — End: 2019-07-21

## 2019-07-21 MED ORDER — GENERIC EXTERNAL MEDICATION
30.00 | Status: DC
Start: 2019-07-21 — End: 2019-07-21

## 2019-07-21 MED ORDER — HYDROMORPHONE HCL 1 MG/ML IJ SOLN
0.50 | INTRAMUSCULAR | Status: DC
Start: ? — End: 2019-07-21

## 2019-07-21 MED ORDER — DIPHENHYDRAMINE HCL 50 MG/ML IJ SOLN
12.50 | INTRAMUSCULAR | Status: DC
Start: 2019-07-21 — End: 2019-07-21

## 2019-07-21 MED ORDER — HYDROCODONE-ACETAMINOPHEN 7.5-325 MG/15ML PO SOLN
15.00 | ORAL | Status: DC
Start: ? — End: 2019-07-21

## 2019-07-21 MED ORDER — FAMOTIDINE 20 MG/2ML IV SOLN
20.00 | INTRAVENOUS | Status: DC
Start: 2019-07-21 — End: 2019-07-21

## 2019-07-21 MED ORDER — ENOXAPARIN SODIUM 40 MG/0.4ML ~~LOC~~ SOLN
40.00 | SUBCUTANEOUS | Status: DC
Start: 2019-07-22 — End: 2019-07-21

## 2019-07-21 MED ORDER — HYDROCODONE-ACETAMINOPHEN 5-325 MG PO TABS
1.00 | ORAL_TABLET | ORAL | Status: DC
Start: ? — End: 2019-07-21

## 2020-06-07 ENCOUNTER — Emergency Department (HOSPITAL_BASED_OUTPATIENT_CLINIC_OR_DEPARTMENT_OTHER)
Admission: EM | Admit: 2020-06-07 | Discharge: 2020-06-07 | Disposition: A | Discharge status: Home / Self Care | Payer: Managed Care, Other (non HMO) | Attending: Emergency Medicine | Admitting: Emergency Medicine

## 2020-06-07 ENCOUNTER — Emergency Department (HOSPITAL_BASED_OUTPATIENT_CLINIC_OR_DEPARTMENT_OTHER): Payer: Managed Care, Other (non HMO)

## 2020-06-07 ENCOUNTER — Other Ambulatory Visit: Payer: Self-pay

## 2020-06-07 ENCOUNTER — Encounter (HOSPITAL_BASED_OUTPATIENT_CLINIC_OR_DEPARTMENT_OTHER): Payer: Self-pay | Admitting: Emergency Medicine

## 2020-06-07 DIAGNOSIS — R071 Chest pain on breathing: Secondary | ICD-10-CM | POA: Insufficient documentation

## 2020-06-07 DIAGNOSIS — R079 Chest pain, unspecified: Secondary | ICD-10-CM

## 2020-06-07 DIAGNOSIS — Z79899 Other long term (current) drug therapy: Secondary | ICD-10-CM | POA: Insufficient documentation

## 2020-06-07 DIAGNOSIS — I10 Essential (primary) hypertension: Secondary | ICD-10-CM | POA: Insufficient documentation

## 2020-06-07 DIAGNOSIS — R0781 Pleurodynia: Secondary | ICD-10-CM

## 2020-06-07 LAB — CBC
HCT: 38.7 % (ref 36.0–46.0)
Hemoglobin: 12.6 g/dL (ref 12.0–15.0)
MCH: 26.9 pg (ref 26.0–34.0)
MCHC: 32.6 g/dL (ref 30.0–36.0)
MCV: 82.5 fL (ref 80.0–100.0)
Platelets: 282 10*3/uL (ref 150–400)
RBC: 4.69 MIL/uL (ref 3.87–5.11)
RDW: 15.1 % (ref 11.5–15.5)
WBC: 6.8 10*3/uL (ref 4.0–10.5)
nRBC: 0 % (ref 0.0–0.2)

## 2020-06-07 LAB — BASIC METABOLIC PANEL
Anion gap: 11 (ref 5–15)
BUN: 11 mg/dL (ref 6–20)
CO2: 24 mmol/L (ref 22–32)
Calcium: 8.8 mg/dL — ABNORMAL LOW (ref 8.9–10.3)
Chloride: 102 mmol/L (ref 98–111)
Creatinine, Ser: 0.87 mg/dL (ref 0.44–1.00)
GFR calc Af Amer: >60 mL/min (ref >60)
GFR calc non Af Amer: >60 mL/min (ref >60)
Glucose, Bld: 89 mg/dL (ref 70–99)
Potassium: 4.3 mmol/L (ref 3.5–5.1)
Sodium: 137 mmol/L (ref 135–145)

## 2020-06-07 LAB — TROPONIN I (HIGH SENSITIVITY)
Troponin I (High Sensitivity): 3 ng/L (ref <18)
Troponin I (High Sensitivity): 3 ng/L (ref <18)

## 2020-06-07 LAB — PROTIME-INR
INR: 1 (ref 0.8–1.2)
Prothrombin Time: 13 seconds (ref 11.4–15.2)

## 2020-06-07 LAB — PREGNANCY, URINE: Preg Test, Ur: NEGATIVE

## 2020-06-07 MED ORDER — IOHEXOL 350 MG/ML SOLN
100.0000 mL | Freq: Once | INTRAVENOUS | Status: AC | PRN
  Administered 2020-06-07: 100 mL via INTRAVENOUS

## 2020-06-07 NOTE — ED Triage Notes (Signed)
Reports chest pain yesterday after walk. covid + x 10 days. Had ECG at PCP today ,sent for further evaluation

## 2020-06-07 NOTE — ED Provider Notes (Signed)
[facility] EMERGENCY DEPARTMENT Provider Note   CSN: [id number] Arrival date & time: 06/07/20  1303     History Chief Complaint  Patient presents with  . Chest Pain    Wendy Gordon is a 34 y.o. female.  HPI Patient is a 34 year old female with history of hypertension, thyroid disease, endometriosis. Patient states she has been vaccinated for COVID-19. About 10 days ago she began experiencing a headache and fatigue and ultimately found out she was positive for COVID-19. She states her symptoms persisted but were quite mild. She followed up with her PCP yesterday who medically cleared her. She then went for a walk yesterday evening and states afterwards she began experiencing sharp, intermittent, left-sided chest pain. Her symptoms lasted for a short period of time and will spontaneously alleviate. No modifying factors.  No nausea or vomiting, radiation of pain, diaphoresis.  Her symptoms persisted throughout the night and have mostly alleviated throughout the day today. Denies any shortness of breath, lightheadedness, weakness, syncope when they occur. She had an ECG at her PCP today and was sent to the ED for further evaluation.  Prior to coming to the ED she was given nitroglycerin as well as ASA and notes mild relief of her pain.  Patient notes her history of endometriosis and states that she takes norethindrone for this. Her dosage was increased from 10 mg to 15 mg the day before she was diagnosed with Covid. No fevers, chills, current URI symptoms, shortness of breath, abdominal pain, nausea, vomiting.     Past Medical History:  Diagnosis Date  . Hypertension   . Thyroid disease     There are no problems to display for this patient.   Past Surgical History:  Procedure Laterality Date  . CESAREAN SECTION    . CHOLECYSTECTOMY    . TUBAL LIGATION       OB History   No obstetric history on file.     No family history on file.  Social History   Tobacco Use  .  Smoking status: Never Smoker  . Smokeless tobacco: Never Used  Substance Use Topics  . Alcohol use: No  . Drug use: No    Home Medications Prior to Admission medications   Medication Sig Start Date End Date Taking? Authorizing Provider  cyclobenzaprine (FLEXERIL) 10 MG tablet Take 1 tablet (10 mg total) by mouth 2 (two) times daily as needed for muscle spasms. 06/07/17   Law, Waylan BogaAlexandra M, PA-C  hydrochlorothiazide (HYDRODIURIL) 25 MG tablet Take 25 mg by mouth daily.    [provider]  ibuprofen (ADVIL,MOTRIN) 800 MG tablet Take 1 tablet (800 mg total) by mouth 3 (three) times daily. 06/07/17   Law, Waylan BogaAlexandra M, PA-C  methocarbamol (ROBAXIN) 500 MG tablet Take 1 tablet (500 mg total) by mouth 2 (two) times daily. 07/27/18   Dartha LodgeFord, Kelsey N, PA-C  naproxen (NAPROSYN) 500 MG tablet Take 1 tablet (500 mg total) by mouth 2 (two) times daily. 07/27/18   Dartha LodgeFord, Kelsey N, PA-C    Allergies    Hydrocodone bitartrate  Review of Systems   Review of Systems  All other systems reviewed and are negative. Ten systems reviewed and are negative for acute change, except as noted in the HPI.    Physical Exam Updated Vital Signs BP (!) 155/94   Pulse 71   Temp 98.7 F (37.1 C) (Oral)   Resp 18   Ht 5\' 2"  (1.575 m)   Wt 106.1 kg   SpO2 100%  BMI 42.80 kg/m   Physical Exam Vitals and nursing note reviewed.  Constitutional:      General: She is not in acute distress.    Appearance: Normal appearance. She is well-developed. She is obese. She is not ill-appearing, toxic-appearing or diaphoretic.  HENT:     Head: Normocephalic and atraumatic.     Right Ear: External ear normal.     Left Ear: External ear normal.     Nose: Nose normal.     Mouth/Throat:     Mouth: Mucous membranes are moist.     Pharynx: Oropharynx is clear. No oropharyngeal exudate or posterior oropharyngeal erythema.  Eyes:     Extraocular Movements: Extraocular movements intact.  Cardiovascular:     Rate and  Rhythm: Normal rate and regular rhythm.     Pulses: Normal pulses.          Radial pulses are 2+ on the right side and 2+ on the left side.       Dorsalis pedis pulses are 2+ on the right side and 2+ on the left side.     Heart sounds: Normal heart sounds. Heart sounds not distant. No murmur heard.  No systolic murmur is present.  No diastolic murmur is present.  No friction rub. No gallop.      Comments: Heart is regular rate and rhythm. No murmurs, rubs, gallops. 2+ radial and DP pulses noted bilaterally. Pulmonary:     Effort: Pulmonary effort is normal. No respiratory distress.     Breath sounds: Normal breath sounds. No stridor. No decreased breath sounds, wheezing, rhonchi or rales.     Comments: Lungs are clear to auscultation bilaterally. Patient is saturating at 100%. Chest:     Chest wall: No mass, deformity or tenderness.     Comments: No anterior chest wall tenderness. Abdominal:     General: Abdomen is flat.     Tenderness: There is no abdominal tenderness.  Musculoskeletal:        General: Normal range of motion.     Cervical back: Normal range of motion and neck supple. No tenderness.     Right lower leg: No tenderness. No edema.     Left lower leg: No tenderness. No edema.     Comments: No calf pain or leg swelling.  Skin:    General: Skin is warm and dry.  Neurological:     General: No focal deficit present.     Mental Status: She is alert and oriented to person, place, and time.  Psychiatric:        Mood and Affect: Mood normal.        Behavior: Behavior normal.    ED Results / Procedures / Treatments   Labs (all labs ordered are listed, but only abnormal results are displayed) Labs Reviewed  BASIC METABOLIC PANEL - Abnormal; Notable for the following components:      Result Value   Calcium 8.8 (*)    All other components within normal limits  CBC  PREGNANCY, URINE  PROTIME-INR  TROPONIN I (HIGH SENSITIVITY)  TROPONIN I (HIGH SENSITIVITY)   EKG EKG  Interpretation  Date/Time:  Friday June 07 2020 13:35:44 EDT Ventricular Rate:  67 PR Interval:  132 QRS Duration: 92 QT Interval:  382 QTC Calculation: 403 R Axis:   4 Text Interpretation: Normal sinus rhythm Minimal voltage criteria for LVH, may be normal variant ( R in aVL ) new Nonspecific T wave abnormality Confirmed by Gwyneth Sprout ([id number]) on 06/07/2020 4:43:38  PM  Radiology CT Angio Chest PE W and/or Wo Contrast  Result Date: 06/07/2020 CLINICAL DATA:  Recent COVID chest pain EXAM: CT ANGIOGRAPHY CHEST WITH CONTRAST TECHNIQUE: Multidetector CT imaging of the chest was performed using the standard protocol during bolus administration of intravenous contrast. Multiplanar CT image reconstructions and MIPs were obtained to evaluate the vascular anatomy. CONTRAST:  OMNIPAQUE IOHEXOL 350 MG/ML SOLN COMPARISON:  Chest x-ray 06/07/2020 FINDINGS: Cardiovascular: Satisfactory opacification of the pulmonary arteries to the segmental level. No evidence of pulmonary embolism. Normal heart size. No pericardial effusion. Nonaneurysmal aorta. No dissection is seen. Mediastinum/Nodes: No enlarged mediastinal, hilar, or axillary lymph nodes. Thyroid gland, trachea, and esophagus demonstrate no significant findings. Lungs/Pleura: Lungs are clear. No pleural effusion or pneumothorax. Upper Abdomen: Postsurgical changes of the stomach. No acute abnormality Musculoskeletal: No chest wall abnormality. No acute or significant osseous findings. Review of the MIP images confirms the above findings. IMPRESSION: Negative. No CT evidence for acute pulmonary embolus or aortic dissection. Clear lung fields. Electronically Signed   By: Jasmine Pang M.D.   On: 06/07/2020 18:06   DG Chest Port 1 View  Result Date: 06/07/2020 CLINICAL DATA:  COVID-19 positivity for several days with acute chest pain EXAM: PORTABLE CHEST 1 VIEW COMPARISON:  12/02/2017 FINDINGS: The heart size and mediastinal contours are within  normal limits. Both lungs are clear. The visualized skeletal structures are unremarkable. IMPRESSION: No active disease. Electronically Signed   By: Alcide Clever M.D.   On: 06/07/2020 13:56   Procedures Procedures (including critical care time)  Medications Ordered in ED Medications  iohexol (OMNIPAQUE) 350 MG/ML injection 100 mL (100 mLs Intravenous Contrast Given 06/07/20 1746)   ED Course  I have reviewed the triage vital signs and the nursing notes.  Pertinent labs & imaging results that were available during my care of the patient were reviewed by me and considered in my medical decision making (see chart for details).  Clinical Course as of Jun 08 1823  Fri Jun 07, 2020  1729 Basic labs obtained in triage and are all reassuring. Initial troponin is not elevated. Very mild hypocalcemia at 8.8. Given patient's pleuritic chest pain, recent COVID-19 infection, norethindrone use, will obtain a CT scan of the chest to rule out PE.   [LJ]  1812 Negative. No CT evidence for acute pulmonary embolus or aortic dissection. Clear lung fields.  CT Angio Chest PE W and/or Wo Contrast [LJ]    Clinical Course User Index [LJ] Placido Sou, PA-C   MDM Rules/Calculators/A&P                          Pt is a 34 y.o. female that presents with a history, physical exam, and ED Clinical Course as noted above.   Patient presents today with intermittent left-sided sharp chest pain that began last night.  Patient is vaccinated for COVID-19 but developed a COVID-19 infection 10 days ago.  Her chest pain started yesterday evening after taking a very long walk.  Patient states she went to her PCP today had an ECG performed which was "abnormal" and was given ASA as well as nitroglycerin which she stated provided mild relief of her symptoms.  She does note that her symptoms were already beginning to improve prior to receiving these medications.  When arriving in the emergency department basic labs were  obtained.  Patient has a mild hypocalcemia of 8.8, otherwise her labs are very reassuring.  CBC, PT/INR,  troponin are all within normal limits.  Pregnancy test is negative.  Patient's heart score is a 2 based on my calculations.  Due to the nature of the patient's symptoms as well as her recent COVID-19 infection I obtained a CTA of the chest to rule out pulmonary embolism.  This was negative.  No evidence of PE or aortic dissection.  Clear lungs.  Patient is hemodynamically stable and in NAD at the time of d/c. Evaluation does not show pathology that would require ongoing emergent intervention or inpatient treatment. I explained the diagnosis to the patient. Patient is comfortable with above plan and is stable for discharge at this time. All questions were answered prior to disposition. Strict return precautions for returning to the ED were discussed. Encouraged follow up with PCP.    An After Visit Summary was printed and given to the patient.  Patient discharged to home/self care.  Condition at discharge: Stable  Note: Portions of this report may have been transcribed using voice recognition software. Every effort was made to ensure accuracy; however, inadvertent computerized transcription errors may be present.   Final Clinical Impression(s) / ED Diagnoses Final diagnoses:  Pleuritic chest pain   Rx / DC Orders ED Discharge Orders    None       Placido Sou, PA-C 06/07/20 Franco Collet, MD 06/07/20 1949

## 2020-06-07 NOTE — ED Notes (Signed)
Patient transported to CT 

## 2020-06-07 NOTE — Discharge Instructions (Addendum)
Please come back to the ER with any new or worsening symptoms.

## 2022-08-17 IMAGING — CT CT ANGIO CHEST
2 of 9 series · 19 of 36 positions shown · IV contrast (omnipaque)
Comparison: Chest x-ray 06/07/2020

CLINICAL DATA: Recent COVID chest pain

EXAM:
CT ANGIOGRAPHY CHEST WITH CONTRAST
TECHNIQUE: Multidetector CT imaging of the chest was performed using the
standard protocol during bolus administration of intravenous
contrast. Multiplanar CT image reconstructions and MIPs were
obtained to evaluate the vascular anatomy.
CONTRAST:  100mL OMNIPAQUE IOHEXOL 350 MG/ML SOLN

[Series 6: pe thins · axial · 0.80mm/px · z∈[-262,-15]mm · 18 of 277 slices shown]
[im 15/277  lung]
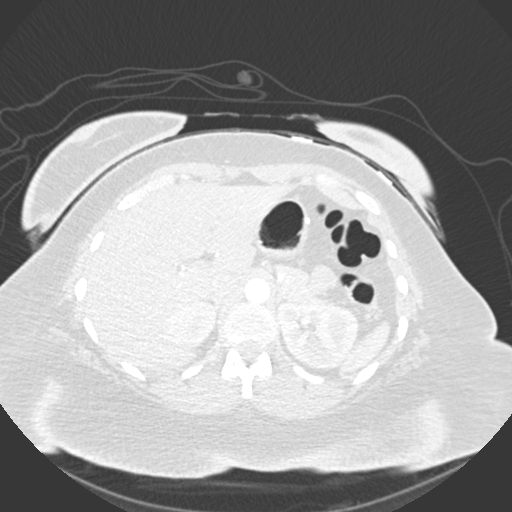
[im 30/277  mediastinal]
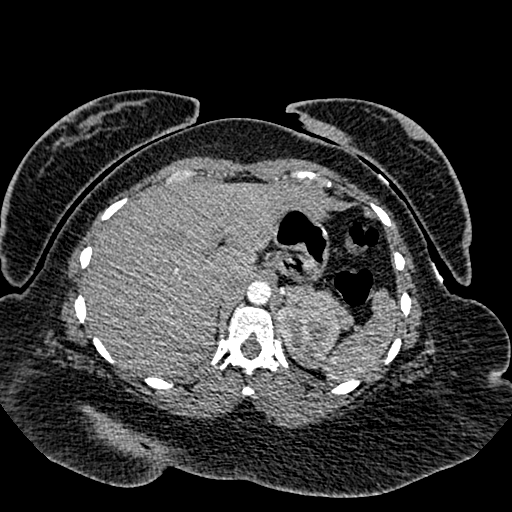
[im 44/277  lung]
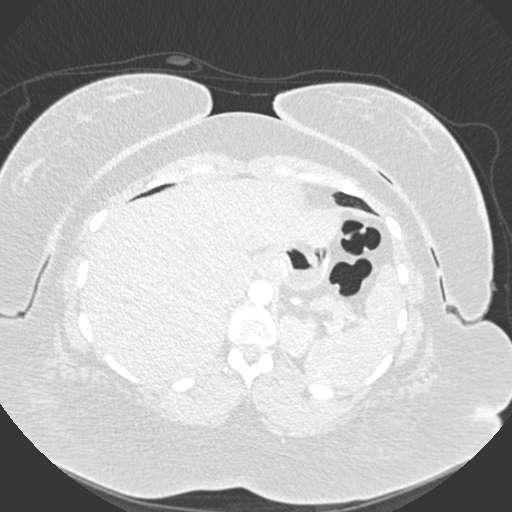
[im 59/277  mediastinal]
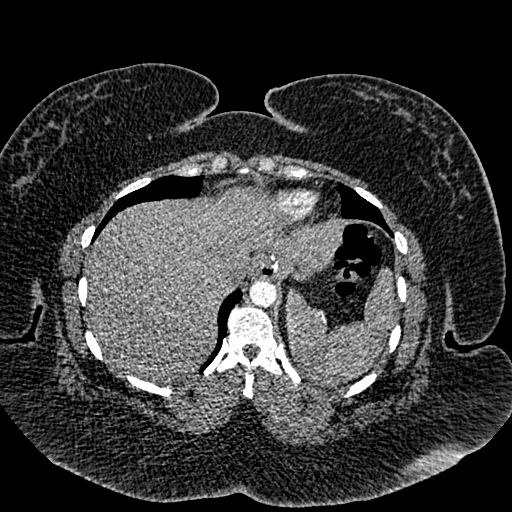
[im 73/277  lung]
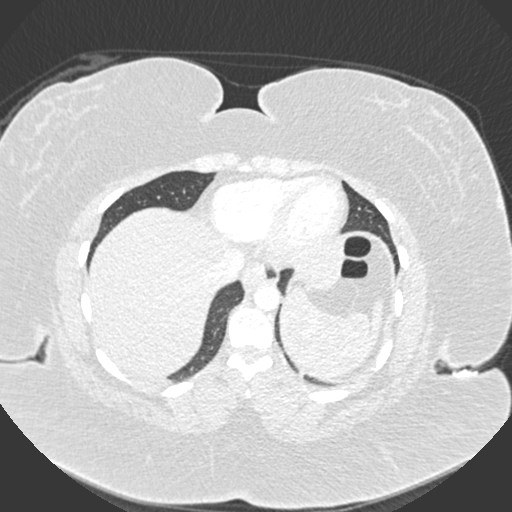
[im 88/277  mediastinal]
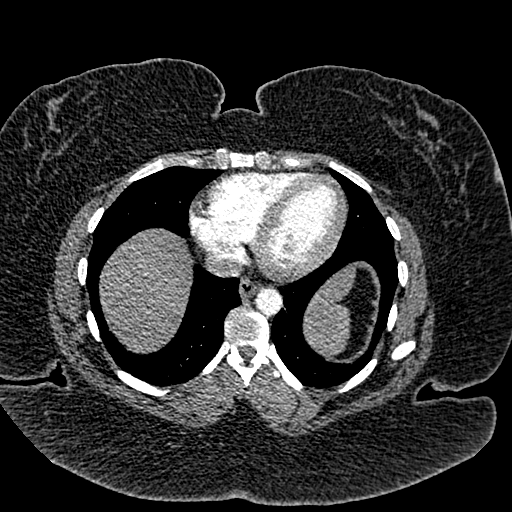
[im 102/277  lung]
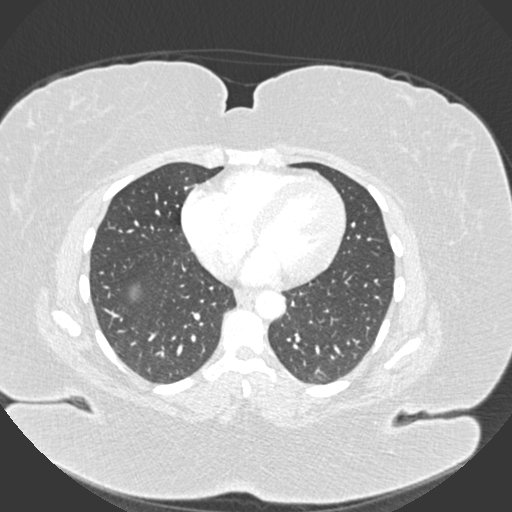
[im 117/277  mediastinal]
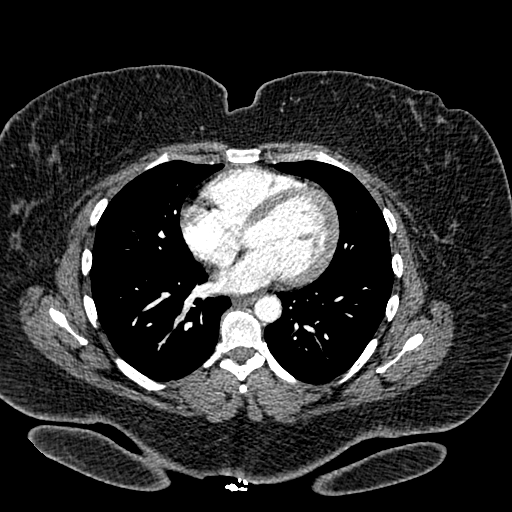
[im 131/277  lung]
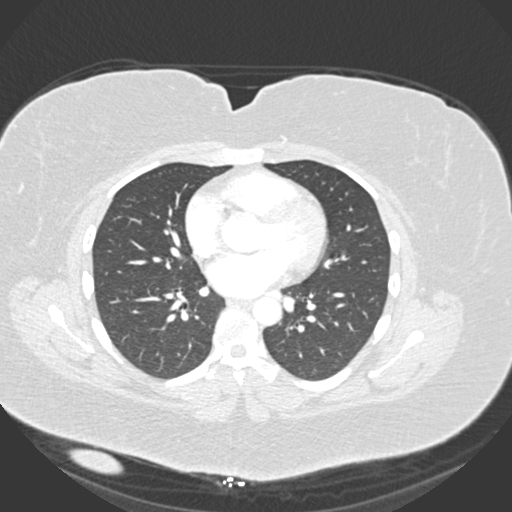
[im 146/277  mediastinal]
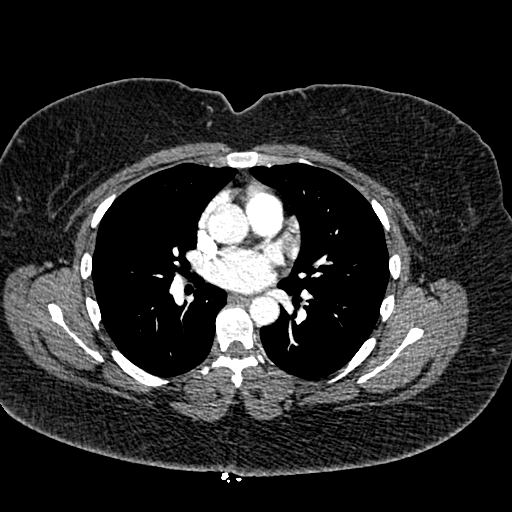
[im 160/277  lung]
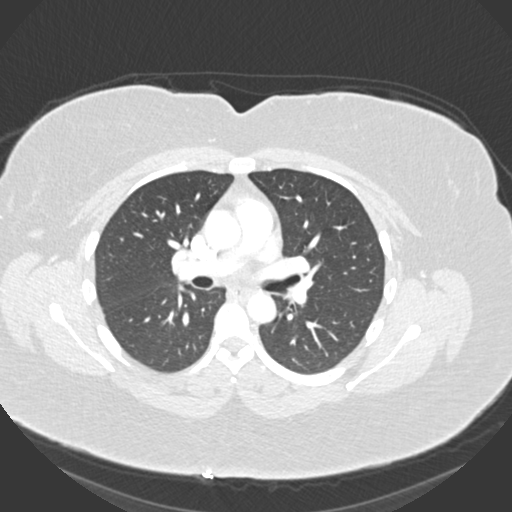
[im 175/277  mediastinal]
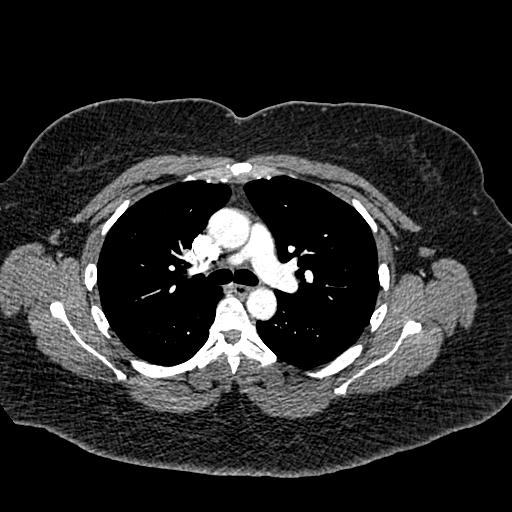
[im 189/277  lung]
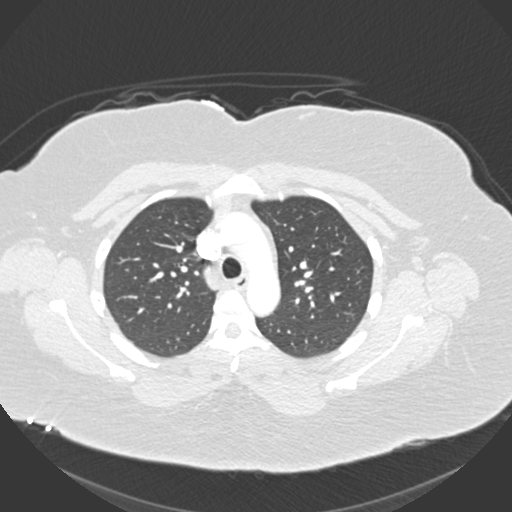
[im 204/277  mediastinal]
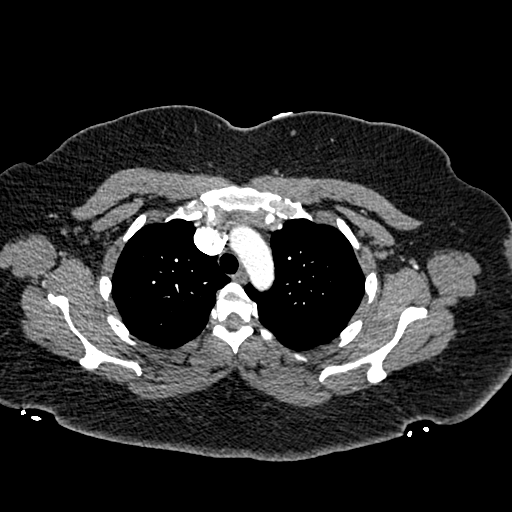
[im 218/277  lung]
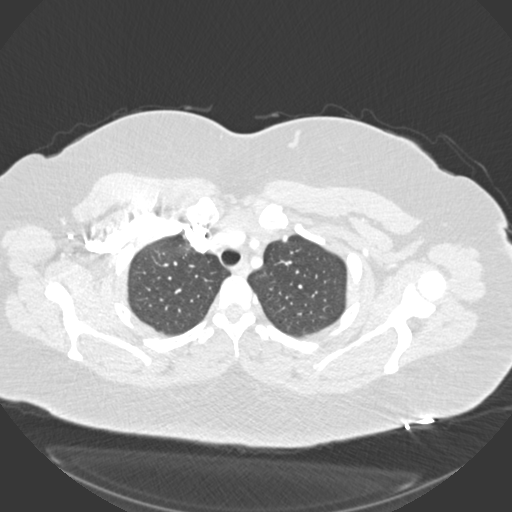
[im 233/277  mediastinal]
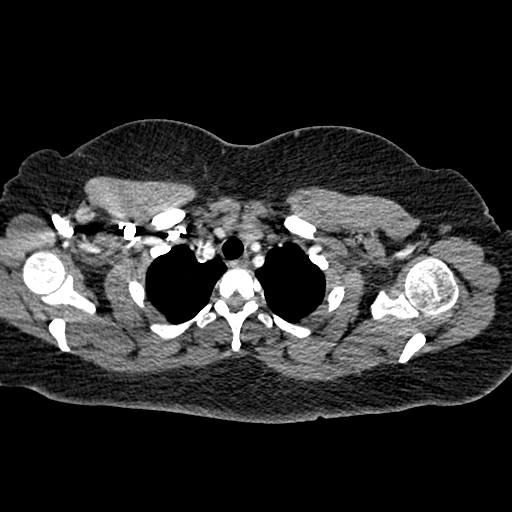
[im 247/277  lung]
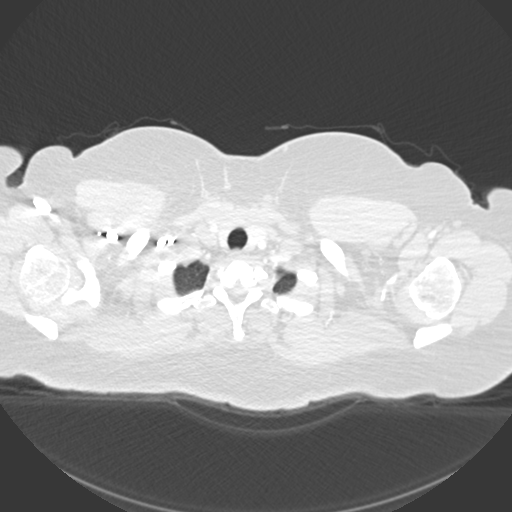
[im 262/277  mediastinal]
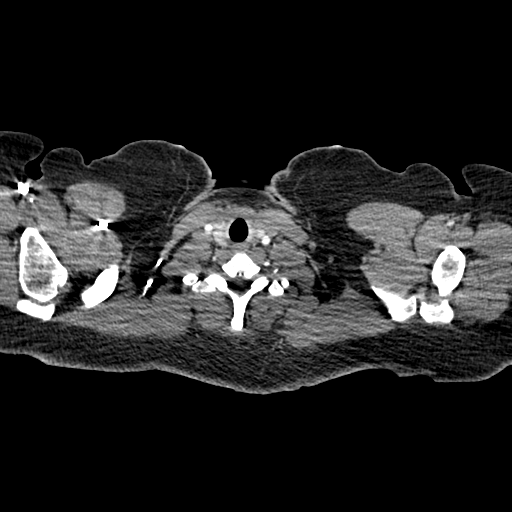

[Series 8: pe coronal mpr · coronal · 0.57mm/px · 1 of 162 slices shown]
[im 81/162  mediastinal]
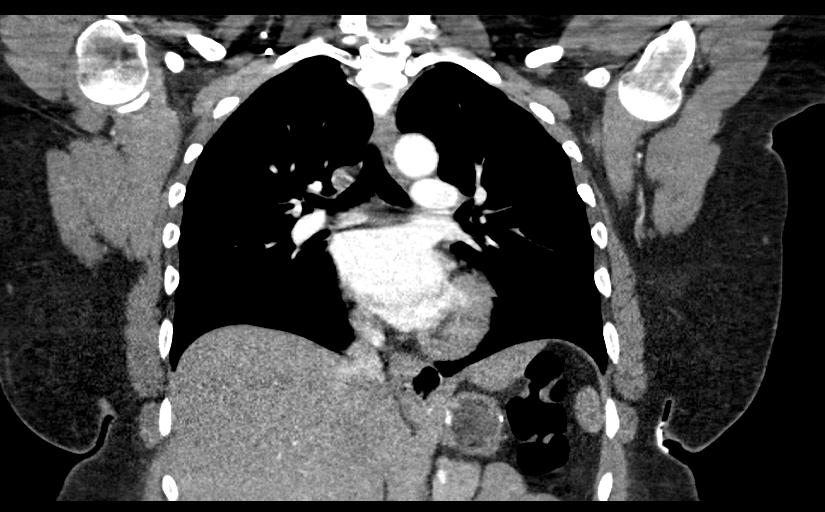

[19 of 36 positions shown; findings below may reference images not displayed]

FINDINGS: Cardiovascular: Satisfactory opacification of the pulmonary arteries
to the segmental level. No evidence of pulmonary embolism. Normal
heart size. No pericardial effusion. Nonaneurysmal aorta. No
dissection is seen.

Mediastinum/Nodes: No enlarged mediastinal, hilar, or axillary lymph
nodes. Thyroid gland, trachea, and esophagus demonstrate no
significant findings.

Lungs/Pleura: Lungs are clear. No pleural effusion or pneumothorax.

Upper Abdomen: Postsurgical changes of the stomach. No acute
abnormality

Musculoskeletal: No chest wall abnormality. No acute or significant
osseous findings.

Review of the MIP images confirms the above findings.
IMPRESSION: Negative. No CT evidence for acute pulmonary embolus or aortic
dissection. Clear lung fields.

## 2023-12-20 ENCOUNTER — Ambulatory Visit
Admission: EM | Admit: 2023-12-20 | Discharge: 2023-12-20 | Disposition: A | Discharge status: Home / Self Care | Attending: Family Medicine | Admitting: Family Medicine

## 2023-12-20 ENCOUNTER — Other Ambulatory Visit: Payer: Self-pay

## 2023-12-20 DIAGNOSIS — K529 Noninfective gastroenteritis and colitis, unspecified: Secondary | ICD-10-CM | POA: Diagnosis not present

## 2023-12-20 MED ORDER — ONDANSETRON HCL 8 MG PO TABS
8.0000 mg | ORAL_TABLET | Freq: Three times a day (TID) | ORAL | 0 refills | Status: DC | PRN
Start: 2023-12-20 — End: 2024-07-19

## 2023-12-20 MED ORDER — ONDANSETRON 4 MG PO TBDP
4.0000 mg | ORAL_TABLET | Freq: Once | ORAL | Status: AC
  Administered 2023-12-20: 4 mg via ORAL

## 2023-12-20 NOTE — ED Provider Notes (Signed)
 [facility]    CSN: [id number] Arrival date & time: 12/20/23  1334      History   Chief Complaint Chief Complaint  Patient presents with   Emesis   Diarrhea    HPI Wendy Gordon is a 38 y.o. female.   Patient got up and went to work today.  She felt normal initially.  As the morning progressed she started having increasing severe nausea.  She started vomiting.  She is also had 1 spell of diarrhea.  No known exposure to stomach flu.  States that the nausea is unremitting.  No fever chills body ache.  No cough or congestion    Past Medical History:  Diagnosis Date   Hypertension    Thyroid disease     There are no active problems to display for this patient.   Past Surgical History:  Procedure Laterality Date   CESAREAN SECTION     CHOLECYSTECTOMY     HYSTERECTOMY ABDOMINAL WITH SALPINGECTOMY     TUBAL LIGATION      OB History   No obstetric history on file.      Home Medications    Prior to Admission medications   Medication Sig Start Date End Date Taking? Authorizing Provider  Elagolix Sodium 150 MG TABS Take by mouth.   Yes [provider]  gabapentin (NEURONTIN) 400 MG capsule Take 800 mg by mouth 3 (three) times daily.   Yes [provider]  ondansetron (ZOFRAN) 8 MG tablet Take 1 tablet (8 mg total) by mouth every 8 (eight) hours as needed for nausea or vomiting. 12/20/23  Yes Eustace Moore, MD  cyclobenzaprine (FLEXERIL) 10 MG tablet Take 1 tablet (10 mg total) by mouth 2 (two) times daily as needed for muscle spasms. 06/07/17   Law, Waylan Boga, PA-C  hydrochlorothiazide (HYDRODIURIL) 25 MG tablet Take 25 mg by mouth daily.    [provider]    Family History History reviewed. No pertinent family history.  Social History Social History   Tobacco Use   Smoking status: Never   Smokeless tobacco: Never  Substance Use Topics   Alcohol use: No   Drug use: No     Allergies   Hydrocodone  bitartrate   Review of Systems Review of Systems See hpi  Physical Exam Triage Vital Signs ED Triage Vitals  Encounter Vitals Group     BP 12/20/23 1344 121/86     Systolic BP Percentile --      Diastolic BP Percentile --      Pulse Rate 12/20/23 1344 74     Resp 12/20/23 1344 18     Temp 12/20/23 1344 (!) 97.5 F (36.4 C)     Temp src --      SpO2 12/20/23 1344 95 %     Weight --      Height --      Head Circumference --      Peak Flow --      Pain Score 12/20/23 1347 6     Pain Loc --      Pain Education --      Exclude from Growth Chart --    No data found.  Updated Vital Signs BP 121/86   Pulse 74   Temp (!) 97.5 F (36.4 C)   Resp 18   LMP 05/28/2016   SpO2 95%       Physical Exam Constitutional:      General: She is not in acute distress.  Appearance: She is well-developed. She is obese. She is ill-appearing.  HENT:     Head: Normocephalic and atraumatic.  Eyes:     Conjunctiva/sclera: Conjunctivae normal.     Pupils: Pupils are equal, round, and reactive to light.  Cardiovascular:     Rate and Rhythm: Normal rate and regular rhythm.     Heart sounds: Normal heart sounds.  Pulmonary:     Effort: Pulmonary effort is normal. No respiratory distress.     Breath sounds: Normal breath sounds.  Abdominal:     General: There is no distension.     Palpations: Abdomen is soft.     Tenderness: There is no abdominal tenderness.  Musculoskeletal:        General: Normal range of motion.     Cervical back: Normal range of motion.  Skin:    General: Skin is warm and dry.  Neurological:     Mental Status: She is alert.      UC Treatments / Results  Labs (all labs ordered are listed, but only abnormal results are displayed) Labs Reviewed - No data to display  EKG   Radiology No results found.  Procedures Procedures (including critical care time)  Medications Ordered in UC Medications  ondansetron (ZOFRAN-ODT) disintegrating tablet 4 mg (4  mg Oral Given 12/20/23 1353)    Initial Impression / Assessment and Plan / UC Course  I have reviewed the triage vital signs and the nursing notes.  Pertinent labs & imaging results that were available during my care of the patient were reviewed by me and considered in my medical decision making (see chart for details).     Final Clinical Impressions(s) / UC Diagnoses   Final diagnoses:  Gastroenteritis     Discharge Instructions      Take Zofran 2-3 times a day to stop the nausea and vomiting As soon as you are able to start taking sips of clear liquids, Gatorade, Pedialyte When you can comfortably keep down fluids you may start a bland diet Call if not improving in a couple days   ED Prescriptions     Medication Sig Dispense Auth. Provider   ondansetron (ZOFRAN) 8 MG tablet Take 1 tablet (8 mg total) by mouth every 8 (eight) hours as needed for nausea or vomiting. 20 tablet Eustace Moore, MD      PDMP not reviewed this encounter.   Eustace Moore, MD 12/20/23 [phone]

## 2023-12-20 NOTE — Discharge Instructions (Signed)
 Take Zofran 2-3 times a day to stop the nausea and vomiting As soon as you are able to start taking sips of clear liquids, Gatorade, Pedialyte When you can comfortably keep down fluids you may start a bland diet Call if not improving in a couple days

## 2023-12-20 NOTE — ED Triage Notes (Signed)
 N/v/d since this morning. Also has crampy abdominal pain.  No fever. No otc meds.

## 2024-07-19 ENCOUNTER — Ambulatory Visit
Admission: EM | Admit: 2024-07-19 | Discharge: 2024-07-19 | Disposition: A | Discharge status: Home / Self Care | Attending: Family Medicine | Admitting: Family Medicine

## 2024-07-19 DIAGNOSIS — R059 Cough, unspecified: Secondary | ICD-10-CM | POA: Diagnosis not present

## 2024-07-19 DIAGNOSIS — R110 Nausea: Secondary | ICD-10-CM

## 2024-07-19 DIAGNOSIS — U071 COVID-19: Secondary | ICD-10-CM | POA: Diagnosis not present

## 2024-07-19 MED ORDER — ONDANSETRON 8 MG PO TBDP: 8.0000 mg | ORAL_TABLET | Freq: Once | ORAL | Status: DC

## 2024-07-19 MED ORDER — ONDANSETRON 8 MG PO TBDP: 8.0000 mg | ORAL_TABLET | Freq: Three times a day (TID) | ORAL | 0 refills | Status: AC | PRN

## 2024-07-19 MED ORDER — PAXLOVID (300/100) 20 X 150 MG & 10 X 100MG PO TBPK: 3.0000 | ORAL_TABLET | Freq: Two times a day (BID) | ORAL | 0 refills | Status: AC

## 2024-07-19 MED ORDER — PREDNISONE 20 MG PO TABS: ORAL_TABLET | ORAL | 0 refills | Status: DC

## 2024-07-19 NOTE — ED Triage Notes (Addendum)
 Pt c/o cough, congestion, fever/chills, nausea and bodyaches/HA since yesterday. Taking theraflu prn. Tested pos for COVID at work before coming to UC.

## 2024-07-19 NOTE — ED Provider Notes (Signed)
 [facility]    CSN: [id number] Arrival date & time: 07/19/24  1041      History   Chief Complaint Chief Complaint  Patient presents with   Covid Positive   Cough   Fever   Nausea    HPI Wendy Gordon is a 38 y.o. female.   HPI 38 year old female presents with recent positive COVID-19 and complains of cough and chills.  PMH significant for obesity, HTN, and thyroid disease.  Past Medical History:  Diagnosis Date   Hypertension    Thyroid disease     There are no active problems to display for this patient.   Past Surgical History:  Procedure Laterality Date   CESAREAN SECTION     CHOLECYSTECTOMY     HYSTERECTOMY ABDOMINAL WITH SALPINGECTOMY     TUBAL LIGATION      OB History   No obstetric history on file.      Home Medications    Prior to Admission medications   Medication Sig Start Date End Date Taking? Authorizing Provider  nirmatrelvir/ritonavir (PAXLOVID, 300/100,) 20 x 150 MG & 10 x 100MG  TBPK Take 3 tablets by mouth 2 (two) times daily for 5 days. Patient GFR is >90. Take nirmatrelvir (150 mg) two tablets twice daily for 5 days and ritonavir (100 mg) one tablet twice daily for 5 days. 07/19/24 07/24/24 Yes Wendy Gordon, Wendy Gordon  ondansetron  (ZOFRAN -ODT) 8 MG disintegrating tablet Take 1 tablet (8 mg total) by mouth every 8 (eight) hours as needed for nausea or vomiting. 07/19/24  Yes Wendy Gordon, Wendy Gordon  predniSONE  (DELTASONE ) 20 MG tablet Take 3 tabs PO daily x 5 days. 07/19/24  Yes Wendy Gordon, Wendy Gordon  cyclobenzaprine  (FLEXERIL ) 10 MG tablet Take 1 tablet (10 mg total) by mouth 2 (two) times daily as needed for muscle spasms. 06/07/17   Wendy Gordon, Wendy Gordon, Wendy Gordon  Elagolix Sodium 150 MG TABS Take by mouth.    [provider]  gabapentin (NEURONTIN) 400 MG capsule Take 800 mg by mouth 3 (three) times daily.    [provider]  hydrochlorothiazide (HYDRODIURIL) 25 MG tablet Take 25 mg by mouth daily.    [provider]     Family History History reviewed. No pertinent family history.  Social History Social History   Tobacco Use   Smoking status: Never   Smokeless tobacco: Never  Substance Use Topics   Alcohol use: No   Drug use: No     Allergies   Hydrocodone  bitartrate   Review of Systems Review of Systems   Physical Exam Triage Vital Signs ED Triage Vitals  Encounter Vitals Group     BP      Girls Systolic BP Percentile      Girls Diastolic BP Percentile      Boys Systolic BP Percentile      Boys Diastolic BP Percentile      Pulse      Resp      Temp      Temp src      SpO2      Weight      Height      Head Circumference      Peak Flow      Pain Score      Pain Loc      Pain Education      Exclude from Growth Chart    No data found.  Updated Vital Signs BP 121/86 (BP Location: Right Wrist)   Pulse (!) 112   Temp  99 F (37.2 C) (Oral)   Resp 17   LMP 05/28/2016   SpO2 94%   Physical Exam Vitals and nursing note reviewed.  Constitutional:      Appearance: Normal appearance. She is obese.  HENT:     Head: Normocephalic and atraumatic.     Right Ear: Tympanic membrane, ear canal and external ear normal.     Left Ear: Tympanic membrane, ear canal and external ear normal.     Mouth/Throat:     Mouth: Mucous membranes are moist.     Pharynx: Oropharynx is clear.  Eyes:     Extraocular Movements: Extraocular movements intact.     Conjunctiva/sclera: Conjunctivae normal.     Pupils: Pupils are equal, round, and reactive to light.  Cardiovascular:     Rate and Rhythm: Normal rate and regular rhythm.     Pulses: Normal pulses.     Heart sounds: Normal heart sounds.  Pulmonary:     Effort: Pulmonary effort is normal.     Breath sounds: Normal breath sounds. No wheezing, rhonchi or rales.  Musculoskeletal:        General: Normal range of motion.  Skin:    General: Skin is warm and dry.  Neurological:     General: No focal deficit present.     Mental  Status: She is alert and oriented to person, place, and time. Mental status is at baseline.  Psychiatric:        Mood and Affect: Mood normal.        Behavior: Behavior normal.      UC Treatments / Results  Labs (all labs ordered are listed, but only abnormal results are displayed) Labs Reviewed - No data to display  EKG   Radiology No results found.  Procedures Procedures (including critical care time)  Medications Ordered in UC Medications  ondansetron  (ZOFRAN -ODT) disintegrating tablet 8 mg (has no administration in time range)    Initial Impression / Assessment and Plan / UC Course  I have reviewed the triage vital signs and the nursing notes.  Pertinent labs & imaging results that were available during my care of the patient were reviewed by me and considered in my medical decision making (see chart for details).     MDM: 1.  COVID-19-Rx'd Paxlovid: Take 3 tablets twice daily x 5 days; 2.  Cough, unspecified type-Rx'd prednisone  20 mg tablet: Take 3 tabs p.o. daily x 5 days.  3.  Nausea-Zofran  8 mg disintegrating tablet given once in clinic and prior to discharge, Rx'd Zofran  8 mg disintegrating tablet: Take 1 tablet every 8 hours, as needed for nausea. Advised patient to take medications as directed with food to completion.  Advised patient to take prednisone  with first dose of Paxlovid for the next 5 days.  Advised patient to hold Elagolix sodium tabs while taking Paxlovid.  Advised may take Zofran  daily or as needed for nausea encouraged increase daily water intake to 64 ounces per day while taking these medications.  Advised if symptoms worsen and/or unresolved please follow-up with your PCP or here for further evaluation.  Patient discharged home, hemodynamically stable.  Work note provided to patient prior to discharge today. Final Clinical Impressions(s) / UC Diagnoses   Final diagnoses:  COVID-19  Cough, unspecified type  Nausea     Discharge Instructions       Advised patient to take medications as directed with food to completion.  Advised patient to take prednisone  with first dose of Paxlovid for the  next 5 days.  Advised patient to hold Elagolix sodium tabs while taking Paxlovid.  Advised may take Zofran  daily or as needed for nausea encouraged increase daily water intake to 64 ounces per day while taking these medications.  Advised if symptoms worsen and/or unresolved please follow-up with your PCP or here for further evaluation.     ED Prescriptions     Medication Sig Dispense Auth. Provider   nirmatrelvir/ritonavir (PAXLOVID, 300/100,) 20 x 150 MG & 10 x 100MG  TBPK Take 3 tablets by mouth 2 (two) times daily for 5 days. Patient GFR is >90. Take nirmatrelvir (150 mg) two tablets twice daily for 5 days and ritonavir (100 mg) one tablet twice daily for 5 days. 30 tablet Kebin Maye, Wendy Gordon   predniSONE  (DELTASONE ) 20 MG tablet Take 3 tabs PO daily x 5 days. 15 tablet Wendy Racette, Wendy Gordon   ondansetron  (ZOFRAN -ODT) 8 MG disintegrating tablet Take 1 tablet (8 mg total) by mouth every 8 (eight) hours as needed for nausea or vomiting. 24 tablet Wendy Llorente, Wendy Gordon      PDMP not reviewed this encounter.   Wendy Gordon, Wendy Gordon 07/19/24 1116

## 2024-07-19 NOTE — Discharge Instructions (Addendum)
 Advised patient to take medications as directed with food to completion.  Advised patient to take prednisone  with first dose of Paxlovid for the next 5 days.  Advised patient to hold Elagolix sodium tabs while taking Paxlovid.  Advised may take Zofran  daily or as needed for nausea encouraged increase daily water intake to 64 ounces per day while taking these medications.  Advised if symptoms worsen and/or unresolved please follow-up with your PCP or here for further evaluation.

## 2024-07-20 ENCOUNTER — Telehealth: Payer: Self-pay

## 2024-07-20 NOTE — Telephone Encounter (Signed)
 Call made to pt to check on status since UC visit. Pt feeling better since UC visit. Advised to call back if any questions or concerns.

## 2024-07-25 ENCOUNTER — Ambulatory Visit
Admission: EM | Admit: 2024-07-25 | Discharge: 2024-07-25 | Disposition: A | Discharge status: Home / Self Care | Attending: Family Medicine | Admitting: Family Medicine

## 2024-07-25 ENCOUNTER — Ambulatory Visit

## 2024-07-25 DIAGNOSIS — R059 Cough, unspecified: Secondary | ICD-10-CM | POA: Diagnosis not present

## 2024-07-25 DIAGNOSIS — J069 Acute upper respiratory infection, unspecified: Secondary | ICD-10-CM | POA: Diagnosis not present

## 2024-07-25 DIAGNOSIS — U071 COVID-19: Secondary | ICD-10-CM | POA: Diagnosis not present

## 2024-07-25 MED ORDER — DOXYCYCLINE HYCLATE 100 MG PO CAPS: 100.0000 mg | ORAL_CAPSULE | Freq: Two times a day (BID) | ORAL | 0 refills | Status: AC

## 2024-07-25 MED ORDER — PREDNISONE 10 MG (21) PO TBPK: ORAL_TABLET | Freq: Every day | ORAL | 0 refills | Status: AC

## 2024-07-25 NOTE — ED Triage Notes (Signed)
 Pt presents to uc with co recent positive covid last week. Pt has been taking paxlovid and prednisone . Pt reports she was feeling better but since Sunday symptoms have worsened with new onset of chest discomfort.

## 2024-07-25 NOTE — ED Provider Notes (Signed)
 [name]    CSN: [id number] Arrival date & time: 07/25/24  1719      History   Chief Complaint Chief Complaint  Patient presents with   Chest Pain   Generalized Body Aches   Covid Positive    HPI Wendy Gordon is a 38 y.o. female.   HPI Very pleasant 38 year old female presents with continued cough, fatigue, and generalized bodyaches.  Patient was evaluated by me on 07/19/2024 for COVID-19.  Please see epic for that encounter note.  Patient reports Paxlovid and prednisone  helped greatly with her symptoms; however, continues to have generalized fatigue and malaise.  PMH significant for obesity, HTN, and thyroid disease  Past Medical History:  Diagnosis Date   Hypertension    Thyroid disease     There are no active problems to display for this patient.   Past Surgical History:  Procedure Laterality Date   CESAREAN SECTION     CHOLECYSTECTOMY     HYSTERECTOMY ABDOMINAL WITH SALPINGECTOMY     TUBAL LIGATION      OB History   No obstetric history on file.      Home Medications    Prior to Admission medications   Medication Sig Start Date End Date Taking? Authorizing Provider  doxycycline (VIBRAMYCIN) 100 MG capsule Take 1 capsule (100 mg total) by mouth 2 (two) times daily for 10 days. 07/25/24 08/04/24 Yes Teddy Sharper, FNP  predniSONE  (STERAPRED UNI-PAK 21 TAB) 10 MG (21) TBPK tablet Take by mouth daily. Take 6 tabs by mouth daily  for 2 days, then 5 tabs for 2 days, then 4 tabs for 2 days, then 3 tabs for 2 days, 2 tabs for 2 days, then 1 tab by mouth daily for 2 days 07/25/24  Yes Teddy Sharper, FNP  cyclobenzaprine  (FLEXERIL ) 10 MG tablet Take 1 tablet (10 mg total) by mouth 2 (two) times daily as needed for muscle spasms. 06/07/17   Law, Alexandra M, PA-C  Elagolix Sodium 150 MG TABS Take by mouth.    [provider]  gabapentin (NEURONTIN) 400 MG capsule Take 800 mg by mouth 3 (three) times daily.    [provider]   hydrochlorothiazide (HYDRODIURIL) 25 MG tablet Take 25 mg by mouth daily.    [provider]  ondansetron  (ZOFRAN -ODT) 8 MG disintegrating tablet Take 1 tablet (8 mg total) by mouth every 8 (eight) hours as needed for nausea or vomiting. 07/19/24   Teddy Sharper, FNP    Family History History reviewed. No pertinent family history.  Social History Social History   Tobacco Use   Smoking status: Never   Smokeless tobacco: Never  Substance Use Topics   Alcohol use: No   Drug use: No     Allergies   Hydrocodone  bitartrate   Review of Systems Review of Systems  Constitutional:  Positive for fatigue.  HENT:  Positive for congestion.   Respiratory:         Chest discomfort for the past 7 days  All other systems reviewed and are negative.    Physical Exam Triage Vital Signs ED Triage Vitals  Encounter Vitals Group     BP      Girls Systolic BP Percentile      Girls Diastolic BP Percentile      Boys Systolic BP Percentile      Boys Diastolic BP Percentile      Pulse      Resp      Temp  Temp src      SpO2      Weight      Height      Head Circumference      Peak Flow      Pain Score      Pain Loc      Pain Education      Exclude from Growth Chart    No data found.  Updated Vital Signs BP 129/80   Pulse 97   Temp 98.3 F (36.8 C)   Resp 19   LMP 05/28/2016   SpO2 98%    Physical Exam Vitals and nursing note reviewed.  Constitutional:      General: She is not in acute distress.    Appearance: Normal appearance. She is obese. She is ill-appearing.  HENT:     Head: Normocephalic and atraumatic.     Right Ear: Tympanic membrane, ear canal and external ear normal.     Left Ear: Tympanic membrane, ear canal and external ear normal.     Mouth/Throat:     Mouth: Mucous membranes are moist.     Pharynx: Oropharynx is clear.  Eyes:     Extraocular Movements: Extraocular movements intact.     Pupils: Pupils are equal, round, and reactive to  light.  Cardiovascular:     Rate and Rhythm: Normal rate and regular rhythm.     Heart sounds: Normal heart sounds.  Pulmonary:     Effort: Pulmonary effort is normal.     Breath sounds: No wheezing, rhonchi or rales.     Comments: Diminished breath sounds noted throughout, infrequent nonproductive cough Chest:     Chest wall: No tenderness.  Musculoskeletal:        General: Normal range of motion.     Cervical back: Normal range of motion and neck supple.  Skin:    General: Skin is warm and dry.  Neurological:     General: No focal deficit present.     Mental Status: She is alert and oriented to person, place, and time. Mental status is at baseline.  Psychiatric:        Mood and Affect: Mood normal.        Behavior: Behavior normal.      UC Treatments / Results  Labs (all labs ordered are listed, but only abnormal results are displayed) Labs Reviewed - No data to display  EKG   Radiology DG Chest 2 View Result Date: 07/25/2024 CLINICAL DATA:  Cough for 10 days.  COVID positive 1 week ago. EXAM: DG CHEST 2V COMPARISON:  Radiograph and CT 06/07/2020 FINDINGS: The cardiomediastinal contours are normal. The lungs are clear. Pulmonary vasculature is normal. No consolidation, pleural effusion, or pneumothorax. No acute osseous abnormalities are seen. IMPRESSION: No active cardiopulmonary disease. Electronically Signed   By: Andrea Gasman M.D.   On: 07/25/2024 18:47    Procedures Procedures (including critical care time)  Medications Ordered in UC Medications - No data to display  Initial Impression / Assessment and Plan / UC Course  I have reviewed the triage vital signs and the nursing notes.  Pertinent labs & imaging results that were available during my care of the patient were reviewed by me and considered in my medical decision making (see chart for details).     MDM: 1.  Acute URI-Rx doxycycline 100 mg capsule: Take 1 capsule twice daily x 10 days; 2.  Cough,  unspecified type-CXR revealed above, patient advised, Rx'd Sterapred Unipak (42 tab 10 mg  taper): Take as directed. Advised patient take medications as directed with food to completion.  Advised to take prednisone  with first dose of doxycycline to completion.  Encouraged to increase daily water intake to 64 ounces per day while taking these medications.  Advised if symptoms worsen and/or unresolved please follow-up with PCP or here for further evaluation.  Final Clinical Impressions(s) / UC Diagnoses   Final diagnoses:  Cough, unspecified type  Acute URI     Discharge Instructions      Advised patient take medications as directed with food to completion.  Advised to take prednisone  with first dose of doxycycline to completion.  Encouraged to increase daily water intake to 64 ounces per day while taking these medications.  Advised if symptoms worsen and/or unresolved please follow-up with PCP or here for further evaluation.     ED Prescriptions     Medication Sig Dispense Auth. Provider   doxycycline (VIBRAMYCIN) 100 MG capsule Take 1 capsule (100 mg total) by mouth 2 (two) times daily for 10 days. 20 capsule Milik Gilreath, FNP   predniSONE  (STERAPRED UNI-PAK 21 TAB) 10 MG (21) TBPK tablet Take by mouth daily. Take 6 tabs by mouth daily  for 2 days, then 5 tabs for 2 days, then 4 tabs for 2 days, then 3 tabs for 2 days, 2 tabs for 2 days, then 1 tab by mouth daily for 2 days 42 tablet Teddy Sharper, FNP      PDMP not reviewed this encounter.   Teddy Sharper, FNP 07/25/24 1925

## 2024-07-25 NOTE — Discharge Instructions (Addendum)
 Advised patient take medications as directed with food to completion.  Advised to take prednisone  with first dose of doxycycline to completion.  Encouraged to increase daily water intake to 64 ounces per day while taking these medications.  Advised if symptoms worsen and/or unresolved please follow-up with PCP or here for further evaluation.
